# Patient Record
Sex: Female | Born: 1960 | Race: White | Hispanic: No | Marital: Married | Smoking: Never smoker
Health system: Southern US, Community
[De-identification: ages and names within clinical notes are randomized; demographics above are authoritative.]

## PROBLEM LIST (undated history)

## (undated) DIAGNOSIS — J45909 Unspecified asthma, uncomplicated: Secondary | ICD-10-CM

## (undated) DIAGNOSIS — I10 Essential (primary) hypertension: Secondary | ICD-10-CM

## (undated) DIAGNOSIS — E785 Hyperlipidemia, unspecified: Secondary | ICD-10-CM

## (undated) DIAGNOSIS — M8580 Other specified disorders of bone density and structure, unspecified site: Secondary | ICD-10-CM

## (undated) DIAGNOSIS — R7303 Prediabetes: Secondary | ICD-10-CM

## (undated) HISTORY — DX: Essential (primary) hypertension: I10

## (undated) HISTORY — PX: ENDOMETRIAL ABLATION: SHX621

## (undated) HISTORY — DX: Other specified disorders of bone density and structure, unspecified site: M85.80

## (undated) HISTORY — DX: Unspecified asthma, uncomplicated: J45.909

## (undated) HISTORY — DX: Hyperlipidemia, unspecified: E78.5

---

## 1987-10-11 HISTORY — PX: NASAL SINUS SURGERY: SHX719

## 1997-11-30 ENCOUNTER — Other Ambulatory Visit: Admission: RE | Admit: 1997-11-30 | Discharge: 1997-11-30 | Payer: Self-pay | Admitting: Gynecology

## 1997-12-02 ENCOUNTER — Other Ambulatory Visit: Admission: RE | Admit: 1997-12-02 | Discharge: 1997-12-02 | Payer: Self-pay | Admitting: Gynecology

## 1998-01-29 ENCOUNTER — Other Ambulatory Visit: Admission: RE | Admit: 1998-01-29 | Discharge: 1998-01-29 | Payer: Self-pay | Admitting: Gynecology

## 1998-04-26 ENCOUNTER — Other Ambulatory Visit: Admission: RE | Admit: 1998-04-26 | Discharge: 1998-04-26 | Payer: Self-pay | Admitting: Gynecology

## 1998-04-28 ENCOUNTER — Other Ambulatory Visit: Admission: RE | Admit: 1998-04-28 | Discharge: 1998-04-28 | Payer: Self-pay | Admitting: Gynecology

## 1998-04-29 ENCOUNTER — Other Ambulatory Visit: Admission: RE | Admit: 1998-04-29 | Discharge: 1998-04-29 | Payer: Self-pay | Admitting: Gynecology

## 1998-10-15 ENCOUNTER — Ambulatory Visit (HOSPITAL_COMMUNITY): Admission: RE | Admit: 1998-10-15 | Discharge: 1998-10-15 | Payer: Self-pay | Admitting: Obstetrics & Gynecology

## 1999-02-22 ENCOUNTER — Encounter (HOSPITAL_COMMUNITY): Admission: RE | Admit: 1999-02-22 | Discharge: 1999-03-08 | Payer: Self-pay | Admitting: Obstetrics and Gynecology

## 1999-03-02 ENCOUNTER — Encounter: Payer: Self-pay | Admitting: Obstetrics and Gynecology

## 1999-03-02 ENCOUNTER — Inpatient Hospital Stay (HOSPITAL_COMMUNITY): Admission: AD | Admit: 1999-03-02 | Discharge: 1999-03-08 | Payer: Self-pay | Admitting: Obstetrics and Gynecology

## 1999-03-03 ENCOUNTER — Encounter: Payer: Self-pay | Admitting: Obstetrics and Gynecology

## 1999-03-04 ENCOUNTER — Encounter: Payer: Self-pay | Admitting: Obstetrics and Gynecology

## 1999-03-09 ENCOUNTER — Encounter (HOSPITAL_COMMUNITY): Admission: RE | Admit: 1999-03-09 | Discharge: 1999-06-07 | Payer: Self-pay | Admitting: Obstetrics and Gynecology

## 1999-04-21 ENCOUNTER — Other Ambulatory Visit: Admission: RE | Admit: 1999-04-21 | Discharge: 1999-04-21 | Payer: Self-pay | Admitting: Obstetrics and Gynecology

## 2000-03-17 ENCOUNTER — Other Ambulatory Visit: Admission: RE | Admit: 2000-03-17 | Discharge: 2000-03-17 | Payer: Self-pay | Admitting: Obstetrics and Gynecology

## 2000-10-23 ENCOUNTER — Inpatient Hospital Stay (HOSPITAL_COMMUNITY): Admission: AD | Admit: 2000-10-23 | Discharge: 2000-10-25 | Payer: Self-pay | Admitting: Obstetrics and Gynecology

## 2000-10-29 ENCOUNTER — Inpatient Hospital Stay (HOSPITAL_COMMUNITY): Admission: AD | Admit: 2000-10-29 | Discharge: 2000-11-01 | Payer: Self-pay | Admitting: Obstetrics and Gynecology

## 2000-10-29 ENCOUNTER — Encounter: Payer: Self-pay | Admitting: Obstetrics and Gynecology

## 2002-03-01 ENCOUNTER — Other Ambulatory Visit: Admission: RE | Admit: 2002-03-01 | Discharge: 2002-03-01 | Payer: Self-pay | Admitting: Obstetrics and Gynecology

## 2003-03-27 ENCOUNTER — Other Ambulatory Visit: Admission: RE | Admit: 2003-03-27 | Discharge: 2003-03-27 | Payer: Self-pay | Admitting: Obstetrics and Gynecology

## 2004-04-14 ENCOUNTER — Other Ambulatory Visit: Admission: RE | Admit: 2004-04-14 | Discharge: 2004-04-14 | Payer: Self-pay | Admitting: Obstetrics and Gynecology

## 2004-08-19 ENCOUNTER — Other Ambulatory Visit: Admission: RE | Admit: 2004-08-19 | Discharge: 2004-08-19 | Payer: Self-pay | Admitting: Obstetrics and Gynecology

## 2004-10-28 ENCOUNTER — Other Ambulatory Visit: Admission: RE | Admit: 2004-10-28 | Discharge: 2004-10-28 | Payer: Self-pay | Admitting: Obstetrics and Gynecology

## 2005-05-13 ENCOUNTER — Other Ambulatory Visit: Admission: RE | Admit: 2005-05-13 | Discharge: 2005-05-13 | Payer: Self-pay | Admitting: Obstetrics and Gynecology

## 2005-11-28 ENCOUNTER — Other Ambulatory Visit: Admission: RE | Admit: 2005-11-28 | Discharge: 2005-11-28 | Payer: Self-pay | Admitting: Obstetrics and Gynecology

## 2011-01-26 ENCOUNTER — Institutional Professional Consult (permissible substitution): Payer: Self-pay | Admitting: Pulmonary Disease

## 2011-03-23 ENCOUNTER — Institutional Professional Consult (permissible substitution): Payer: Self-pay | Admitting: Pulmonary Disease

## 2011-05-24 ENCOUNTER — Encounter: Payer: Self-pay | Admitting: Pulmonary Disease

## 2011-05-24 ENCOUNTER — Ambulatory Visit (INDEPENDENT_AMBULATORY_CARE_PROVIDER_SITE_OTHER): Payer: Managed Care, Other (non HMO) | Admitting: Pulmonary Disease

## 2011-05-24 ENCOUNTER — Ambulatory Visit (INDEPENDENT_AMBULATORY_CARE_PROVIDER_SITE_OTHER)
Admission: RE | Admit: 2011-05-24 | Discharge: 2011-05-24 | Disposition: A | Discharge status: Home / Self Care | Payer: Managed Care, Other (non HMO) | Source: Ambulatory Visit | Attending: Pulmonary Disease | Admitting: Pulmonary Disease

## 2011-05-24 VITALS — BP 138/88 | HR 85 | Temp 97.8°F | Ht 66.0 in | Wt 143.8 lb

## 2011-05-24 DIAGNOSIS — J209 Acute bronchitis, unspecified: Secondary | ICD-10-CM | POA: Insufficient documentation

## 2011-05-24 DIAGNOSIS — J8283 Eosinophilic asthma: Secondary | ICD-10-CM | POA: Insufficient documentation

## 2011-05-24 DIAGNOSIS — J4551 Severe persistent asthma with (acute) exacerbation: Secondary | ICD-10-CM | POA: Insufficient documentation

## 2011-05-24 DIAGNOSIS — J45909 Unspecified asthma, uncomplicated: Secondary | ICD-10-CM

## 2011-05-24 NOTE — Progress Notes (Signed)
  Subjective:    Patient ID: Carol Acosta, female    DOB: [birth year], 50 y.o.   MRN: [id number]  HPI The patient is a 50 year old female who I've been asked to see for asthma.  The patient states that she has had asthma since childhood, and has been an issue for her all of her life.  She is been found to have multiple allergies, and is being managed by Dr. [place] Callas with allergy vaccine.  She is also once symbicort and Singulair.  The patient states that when she is at her usual baseline, she has no breathing issues and the need to use her rescue inhaler.  I actually have spirometry available from April of this year, at which time the patient states she was having trouble breathing, and it shows no significant airflow obstruction.  The patient states that she gets "sick" possibly 4-6 times a year.  This is usually worse in the fall and spring, and starts with allergy symptoms, worsening shortness of breath, worsening sinus symptoms, and in ends in purulent mucus from both her sinuses and chest.  She is usually treated with an antibiotic as well as a course of prednisone with significant improvement.  Her last flareup was in April of this year.  She has a history of chronic sinusitis and had surgery in 1989.  She has not been seen by ENT recently.  She is on allergy vaccine, but Xolair apparently has never been discussed.  She denies any significant reflux symptoms at this time.   Review of Systems  Constitutional: Negative for fever and unexpected weight change.  HENT: Positive for congestion and sneezing. Negative for ear pain, nosebleeds, sore throat, rhinorrhea, trouble swallowing, dental problem, postnasal drip and sinus pressure.   Eyes: Negative for redness and itching.  Respiratory: Positive for shortness of breath. Negative for cough, chest tightness and wheezing.   Cardiovascular: Negative for palpitations and leg swelling.  Gastrointestinal: Negative for nausea and vomiting.  Genitourinary:  Negative for dysuria.  Musculoskeletal: Negative for joint swelling.  Skin: Negative for rash.  Neurological: Positive for headaches.  Hematological: Does not bruise/bleed easily.  Psychiatric/Behavioral: Negative for dysphoric mood. The patient is not nervous/anxious.        Objective:   Physical Exam Constitutional:  Well developed, no acute distress  HENT:  Nares patent, erythema noted bilat, mild crusting.  Oropharynx without exudate, palate and uvula are normal  Eyes:  Perrla, eomi, no scleral icterus  Neck:  No JVD, no TMG  Cardiovascular:  Normal rate, regular rhythm, no rubs or gallops.  No murmurs        Intact distal pulses  Pulmonary :  Normal breath sounds, no stridor or respiratory distress   No rales, rhonchi, or wheezing  Abdominal:  Soft, nondistended, bowel sounds present.  No tenderness noted.   Musculoskeletal:  No lower extremity edema noted.  Lymph Nodes:  No cervical lymphadenopathy noted  Skin:  No cyanosis noted  Neurologic:  Alert, appropriate, moves all 4 extremities without obvious deficit.         Assessment & Plan:

## 2011-05-24 NOTE — Patient Instructions (Addendum)
Will schedule for scan of sinuses.  If normal, would refer back to Dr. [place] Callas for consideration of xolair.  If abnormal, will need ENT evaluation. No change in your current asthma meds.   Will check a cxr today , and call you with results.

## 2011-05-24 NOTE — Assessment & Plan Note (Signed)
The patient has a long-standing history of asthma since childhood, however has been having frequent flareups on a yearly basis.  She has significant allergies and is receiving allergy vaccine, but continues to have issues despite this.  She denies any history of reflux disease.  She is on an excellent pulmonary regimen, and her spirometry in April was totally normal.  I really do not think there is anything to add to her current regimen.  I am most concerned about the possibility of chronic sinusitis, since almost all of her flares begins with sinus symptoms and then develops into an asthma exacerbation per her history.  I would like to do a scan of her sinuses, and if abnormal she will obviously need ENT referral.  If her scan is unremarkable, I think she needs to go back to allergy for discussion about possible Xolair injections if she qualifies.  There is nothing that I would add from a pulmonary standpoint with regards to medications, especially since she has completely normal exertional tolerance and normal spirometry.  The key will be to identify her triggers and treat these appropriately.

## 2011-05-27 ENCOUNTER — Other Ambulatory Visit: Payer: Managed Care, Other (non HMO)

## 2011-05-27 ENCOUNTER — Telehealth: Payer: Self-pay | Admitting: Pulmonary Disease

## 2011-05-27 NOTE — Telephone Encounter (Signed)
Ct sinuses unremarkable.  Megan, please let pt know that her ct sinus is unremarkable.  I would like her to get back with Dr. [place] Callas to discuss if she needs more aggressive allergy control?  ?xolair.  She is on excellent meds from an asthma standpoint, and her breathing tests were normal recently.

## 2011-05-27 NOTE — Telephone Encounter (Signed)
Called and spoke with pt.  Informed her of CT results and KC's recs.  Pt verbalized understanding and denied any questions.

## 2012-06-10 ENCOUNTER — Encounter (HOSPITAL_COMMUNITY): Payer: Self-pay | Admitting: *Deleted

## 2012-06-10 ENCOUNTER — Emergency Department (HOSPITAL_COMMUNITY)
Admission: EM | Admit: 2012-06-10 | Discharge: 2012-06-10 | Disposition: A | Discharge status: Home / Self Care | Payer: BC Managed Care – PPO | Attending: Emergency Medicine | Admitting: Emergency Medicine

## 2012-06-10 DIAGNOSIS — S61209A Unspecified open wound of unspecified finger without damage to nail, initial encounter: Secondary | ICD-10-CM | POA: Insufficient documentation

## 2012-06-10 DIAGNOSIS — W460XXA Contact with hypodermic needle, initial encounter: Secondary | ICD-10-CM | POA: Insufficient documentation

## 2012-06-10 NOTE — ED Notes (Signed)
Pt accidentally exposed self to an epi-pen. Pt stuck epi-pen through R thumb. Applied heat to thumb upon arrival to ED. Pt's thumb was blanched and tingling. Pt's thumb is warm, pink with small amount of blanching.

## 2015-12-04 ENCOUNTER — Other Ambulatory Visit: Payer: Self-pay | Admitting: Allergy

## 2015-12-04 DIAGNOSIS — J329 Chronic sinusitis, unspecified: Secondary | ICD-10-CM

## 2015-12-09 ENCOUNTER — Other Ambulatory Visit: Payer: Self-pay

## 2018-04-26 ENCOUNTER — Other Ambulatory Visit: Payer: Self-pay | Admitting: Obstetrics and Gynecology

## 2018-04-26 DIAGNOSIS — R928 Other abnormal and inconclusive findings on diagnostic imaging of breast: Secondary | ICD-10-CM

## 2018-04-30 ENCOUNTER — Ambulatory Visit
Admission: RE | Admit: 2018-04-30 | Discharge: 2018-04-30 | Disposition: A | Discharge status: Home / Self Care | Payer: BC Managed Care – PPO | Source: Ambulatory Visit | Attending: Obstetrics and Gynecology | Admitting: Obstetrics and Gynecology

## 2018-04-30 DIAGNOSIS — R928 Other abnormal and inconclusive findings on diagnostic imaging of breast: Secondary | ICD-10-CM

## 2019-03-29 ENCOUNTER — Encounter: Payer: Self-pay | Admitting: Obstetrics and Gynecology

## 2019-04-05 ENCOUNTER — Other Ambulatory Visit: Payer: Self-pay | Admitting: Obstetrics and Gynecology

## 2019-04-05 DIAGNOSIS — Z1231 Encounter for screening mammogram for malignant neoplasm of breast: Secondary | ICD-10-CM

## 2019-05-13 ENCOUNTER — Other Ambulatory Visit: Payer: Self-pay

## 2019-05-13 ENCOUNTER — Ambulatory Visit
Admission: RE | Admit: 2019-05-13 | Discharge: 2019-05-13 | Disposition: A | Discharge status: Home / Self Care | Payer: BC Managed Care – PPO | Source: Ambulatory Visit | Attending: Obstetrics and Gynecology | Admitting: Obstetrics and Gynecology

## 2019-05-13 DIAGNOSIS — Z1231 Encounter for screening mammogram for malignant neoplasm of breast: Secondary | ICD-10-CM

## 2019-05-24 ENCOUNTER — Encounter: Payer: Self-pay | Admitting: Obstetrics and Gynecology

## 2019-05-24 ENCOUNTER — Other Ambulatory Visit: Payer: Self-pay

## 2019-05-28 ENCOUNTER — Encounter: Payer: Self-pay | Admitting: Obstetrics and Gynecology

## 2019-05-29 ENCOUNTER — Other Ambulatory Visit: Payer: Self-pay

## 2019-05-29 ENCOUNTER — Ambulatory Visit: Payer: BC Managed Care – PPO | Admitting: Obstetrics and Gynecology

## 2019-05-29 ENCOUNTER — Encounter: Payer: Self-pay | Admitting: Obstetrics and Gynecology

## 2019-05-29 VITALS — BP 138/84 | HR 100 | Temp 97.6°F | Resp 14 | Ht 65.0 in | Wt 154.6 lb

## 2019-05-29 DIAGNOSIS — R300 Dysuria: Secondary | ICD-10-CM

## 2019-05-29 DIAGNOSIS — Z01419 Encounter for gynecological examination (general) (routine) without abnormal findings: Secondary | ICD-10-CM

## 2019-05-29 DIAGNOSIS — R8290 Unspecified abnormal findings in urine: Secondary | ICD-10-CM

## 2019-05-29 LAB — POCT URINALYSIS DIPSTICK
Bilirubin, UA: NEGATIVE
Glucose, UA: NEGATIVE
Ketones, UA: NEGATIVE
Nitrite, UA: NEGATIVE
Protein, UA: NEGATIVE
Urobilinogen, UA: 0.2 E.U./dL
pH, UA: 5 (ref 5.0–8.0)

## 2019-05-29 MED ORDER — SULFAMETHOXAZOLE-TRIMETHOPRIM 800-160 MG PO TABS: 1.0000 | ORAL_TABLET | Freq: Two times a day (BID) | ORAL | 0 refills | Status: DC

## 2019-05-29 NOTE — Progress Notes (Signed)
58 y.o. G2P2 Married Caucasian female here for annual exam.    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   Left side of her face feels numb, starting 3 days ago.  Worse last night, feeling better today.  She does wear a night guard due to clenching.   Feels stressed and exhausted.  No headache currently.  Hx occasional migraine.  No difficulty with word finding.  Has not eaten much today.   Patient recently treated for UTI and not sure completely resolved. Did 7 days of abx.  Macrobid 100 mg.  Symptoms returned again after completing abx.   Took Monistat 3, AZO, cranberry.  Left sided discomfort, senses as pressure, for one week. Last BM was today with diarrhea, which did not relieve the pressure.  Denies vaginal bleeding.  On HRT for 4 years.  Taking HRT every other day. This controls her hot flashes.   Urine Dip:1+RBCs.    PCP: Merri BrunetteWalter Pharr, MD    Patient's last menstrual period was 05/10/2018 (approximate).    LMP was 2002.  Some spotting since then, last time was about 1 year ago.        Sexually active: Yes.    The current method of family planning is none--Ablation.    Exercising: No.  The patient does not participate in regular exercise at present. Smoker:  no  Health Maintenance: Pap: 10-29-14 Neg:Neg HR HPV--pt. Thought had pap 2017 History of abnormal Pap:  Yes, years ago ??may have had cryotherapy MMG:05-13-19 3D Neg/density C/BiRads1  Colonoscopy: 2015 normal;next 10 years BMD:   2018  Result :Osteopenia.  PCP.  TDaP: PCP Gardasil:   NA.  HIV:no Hep C:no Screening Labs:  ---   reports that she has never smoked. She has never used smokeless tobacco. She reports previous alcohol use. She reports that she does not use drugs.  Past Medical History:  Diagnosis Date  . Allergic rhinitis   . Asthma   . Hyperlipidemia   . Hypertension   . Osteopenia     Past Surgical History:  Procedure Laterality Date  . CESAREAN SECTION  2000   . ENDOMETRIAL ABLATION    . NASAL SINUS SURGERY  1989    Current Outpatient Medications  Medication Sig Dispense Refill  . albuterol (PROAIR HFA) 108 (90 Base) MCG/ACT inhaler Inhale 1 puff into the lungs as needed.    Marland Kitchen. albuterol (PROVENTIL) (2.5 MG/3ML) 0.083% nebulizer solution     . BREO ELLIPTA 200-25 MCG/INH AEPB Inhale 1 puff into the lungs daily.    . clotrimazole-betamethasone (LOTRISONE) cream Apply 1 application topically 2 (two) times daily.    Marland Kitchen. desloratadine (CLARINEX) 5 MG tablet Take 5 mg by mouth daily.      Marland Kitchen. estrogen, conjugated,-medroxyprogesterone (PREMPRO) 0.45-1.5 MG tablet Take 1 tablet by mouth every other day.    . pravastatin (PRAVACHOL) 40 MG tablet 1/4 tablet daily    . sertraline (ZOLOFT) 25 MG tablet Take 1 tablet by mouth as needed.    . triamterene-hydrochlorothiazide (DYAZIDE) 37.5-25 MG capsule Take 1 capsule by mouth every other day.     No current facility-administered medications for this visit.     Family History  Problem Relation Age of Onset  . COPD Maternal Grandmother   . Hypertension Maternal Grandmother   . Allergic rhinitis Brother   . Dementia Brother   . Allergic rhinitis Brother   . Asthma Mother   . Allergies Mother   . Hypertension Mother   .  Thyroid disease Mother     Review of Systems  All other systems reviewed and are negative.   Exam:   BP 138/84   Pulse 100   Temp 97.6 F (36.4 C) (Temporal)   Resp 14   Ht 5\' 5"  (1.651 m)   Wt 154 lb 9.6 oz (70.1 kg)   LMP 05/10/2018 (Approximate)   BMI 25.73 kg/m     General appearance: alert, cooperative and appears stated age Head: normocephalic, without obvious abnormality, atraumatic Neck: no adenopathy, supple, symmetrical, trachea midline and thyroid normal to inspection and palpation Lungs: clear to auscultation bilaterally Breasts: normal appearance, no masses or tenderness, No nipple retraction or dimpling, No nipple discharge or bleeding, No axillary  adenopathy Heart: regular rate and rhythm Abdomen: soft, non-tender; no masses, no organomegaly Extremities: extremities normal, atraumatic, no cyanosis or edema Skin: skin color, texture, turgor normal. No rashes or lesions Lymph nodes: cervical, supraclavicular, and axillary nodes normal. Neurologic: grossly normal  Pelvic: External genitalia:  no lesions              No abnormal inguinal nodes palpated.              Urethra:  normal appearing urethra with no masses, tenderness or lesions              Bartholins and Skenes: normal                 Vagina: normal appearing vagina with normal color and discharge, no lesions              Cervix: no lesions              Pap taken: No. Bimanual Exam:  Uterus:  normal size, contour, position, consistency, mobility, non-tender              Adnexa: no mass, fullness, tenderness              Rectal exam: Yes.  .  Confirms.              Anus:  normal sphincter tone, no lesions  Chaperone was present for exam.  Assessment:   Well woman visit with normal exam. Status post endometrial ablation.  HRT.  Abnormal urine dip.  Recent UTI.  Osteopenia.  Left facial numbness.  HTN and elevated cholesterol.  Plan: Mammogram screening discussed. Self breast awareness reviewed. Pap and HR HPV as above. Guidelines for Calcium, Vitamin D, regular exercise program including cardiovascular and weight bearing exercise. Stop HRT. Start [employer].  Urine micro and cx sent.  Bactrim DS.  She will contact her PCP regarding her stress.  She in interested in resuming treatment for this.  She knows to call 911 if her facial numbness increases or does not resolve. Follow up annually and prn.   After visit summary provided.

## 2019-05-29 NOTE — Patient Instructions (Signed)

## 2019-05-30 LAB — URINE CULTURE: Organism ID, Bacteria: NO GROWTH

## 2019-05-30 LAB — URINALYSIS, MICROSCOPIC ONLY: RBC: NONE SEEN /hpf (ref 0–2)

## 2019-05-31 ENCOUNTER — Telehealth: Payer: Self-pay | Admitting: Obstetrics and Gynecology

## 2019-05-31 NOTE — Telephone Encounter (Signed)
Patient is calling regarding Bactrim DS. Patient stated that she cannot handle the medication. Patient stated that after 1 dose she experienced fast heart rate (109-119bpm), panic attack, nausea and dizziness. Patient stated that she has been seen by her PCP today who did an EKG and is treating her for anxiety.   Patient is also wanting to know results from urine culture.

## 2019-05-31 NOTE — Telephone Encounter (Signed)
Urine culture is negative so ok to stop antibiotics.  As this was negative, does not need new rx.  CC: Dr. Quincy Simmonds

## 2019-05-31 NOTE — Telephone Encounter (Signed)
Routing to covering provider, Dr. Sabra Heck, to review and advise.   Dr. Sabra Heck -please review 05/29/19 UC results.   Cc: Dr. Quincy Simmonds

## 2019-05-31 NOTE — Telephone Encounter (Signed)
Spoke with patient, advised per Dr. Sabra Heck. Patient states she discussed with Dr. Quincy Simmonds the tingling sensation she was experiencing and being related to menopause if UC was negative. Patient has not started OTC estroven, asking if a vaginal cream RX would be appropriate? Patient did see PCP today for increased "stress and anxiety", meds have been changed, has f/u with cardiologist. Advised patient will forward to Dr. Quincy Simmonds to review on 8/24 and return call, patient agreeable.   Dr. Quincy Simmonds -please advise.

## 2019-06-03 ENCOUNTER — Ambulatory Visit: Payer: BC Managed Care – PPO | Admitting: Obstetrics and Gynecology

## 2019-06-03 ENCOUNTER — Encounter: Payer: Self-pay | Admitting: Obstetrics and Gynecology

## 2019-06-03 ENCOUNTER — Other Ambulatory Visit: Payer: Self-pay

## 2019-06-03 VITALS — BP 144/82 | HR 88 | Temp 97.3°F | Ht 65.0 in | Wt 153.4 lb

## 2019-06-03 DIAGNOSIS — N760 Acute vaginitis: Secondary | ICD-10-CM

## 2019-06-03 DIAGNOSIS — F419 Anxiety disorder, unspecified: Secondary | ICD-10-CM | POA: Diagnosis not present

## 2019-06-03 NOTE — Patient Instructions (Signed)
Vaginitis Vaginitis is a condition in which the vaginal tissue swells and becomes red (inflamed). This condition is most often caused by a change in the normal balance of bacteria and yeast that live in the vagina. This change causes an overgrowth of certain bacteria or yeast, which causes the inflammation. There are different types of vaginitis, but the most common types are:  Bacterial vaginosis.  Yeast infection (candidiasis).  Trichomoniasis vaginitis. This is a sexually transmitted disease (STD).  Viral vaginitis.  Atrophic vaginitis.  Allergic vaginitis. What are the causes? The cause of this condition depends on the type of vaginitis. It can be caused by:  Bacteria (bacterial vaginosis).  Yeast, which is a fungus (yeast infection).  A parasite (trichomoniasis vaginitis).  A virus (viral vaginitis).  Low hormone levels (atrophic vaginitis). Low hormone levels can occur during pregnancy, breastfeeding, or after menopause.  Irritants, such as bubble baths, scented tampons, and feminine sprays (allergic vaginitis). Other factors can change the normal balance of the yeast and bacteria that live in the vagina. These include:  Antibiotic medicines.  Poor hygiene.  Diaphragms, vaginal sponges, spermicides, birth control pills, and intrauterine devices (IUD).  Sex.  Infection.  Uncontrolled diabetes.  A weakened defense (immune) system. What increases the risk? This condition is more likely to develop in women who:  Smoke.  Use vaginal douches, scented tampons, or scented sanitary pads.  Wear tight-fitting pants.  Wear thong underwear.  Use oral birth control pills or an IUD.  Have sex without a condom.  Have multiple sex partners.  Have an STD.  Frequently use the spermicide nonoxynol-9.  Eat lots of foods high in sugar.  Have uncontrolled diabetes.  Have low estrogen levels.  Have a weakened immune system from an immune disorder or medical  treatment.  Are pregnant or breastfeeding. What are the signs or symptoms? Symptoms vary depending on the cause of the vaginitis. Common symptoms include:  Abnormal vaginal discharge. ? The discharge is white, gray, or yellow with bacterial vaginosis. ? The discharge is thick, white, and cheesy with a yeast infection. ? The discharge is frothy and yellow or greenish with trichomoniasis.  A bad vaginal smell. The smell is fishy with bacterial vaginosis.  Vaginal itching, pain, or swelling.  Sex that is painful.  Pain or burning when urinating. Sometimes there are no symptoms. How is this diagnosed? This condition is diagnosed based on your symptoms and medical history. A physical exam, including a pelvic exam, will also be done. You may also have other tests, including:  Tests to determine the pH level (acidity or alkalinity) of your vagina.  A whiff test, to assess the odor that results when a sample of your vaginal discharge is mixed with a potassium hydroxide solution.  Tests of vaginal fluid. A sample will be examined under a microscope. How is this treated? Treatment varies depending on the type of vaginitis you have. Your treatment may include:  Antibiotic creams or pills to treat bacterial vaginosis and trichomoniasis.  Antifungal medicines, such as vaginal creams or suppositories, to treat a yeast infection.  Medicine to ease discomfort if you have viral vaginitis. Your sexual partner should also be treated.  Estrogen delivered in a cream, pill, suppository, or vaginal ring to treat atrophic vaginitis. If vaginal dryness occurs, lubricants and moisturizing creams may help. You may need to avoid scented soaps, sprays, or douches.  Stopping use of a product that is causing allergic vaginitis. Then using a vaginal cream to treat the symptoms. Follow   these instructions at home: Lifestyle  Keep your genital area clean and dry. Avoid soap, and only rinse the area with  water.  Do not douche or use tampons until your health care provider says it is okay to do so. Use sanitary pads, if needed.  Do not have sex until your health care provider approves. When you can return to sex, practice safe sex and use condoms.  Wipe from front to back. This avoids the spread of bacteria from the rectum to the vagina. General instructions  Take over-the-counter and prescription medicines only as told by your health care provider.  If you were prescribed an antibiotic medicine, take or use it as told by your health care provider. Do not stop taking or using the antibiotic even if you start to feel better.  Keep all follow-up visits as told by your health care provider. This is important. How is this prevented?  Use mild, non-scented products. Do not use things that can irritate the vagina, such as fabric softeners. Avoid the following products if they are scented: ? Feminine sprays. ? Detergents. ? Tampons. ? Feminine hygiene products. ? Soaps or bubble baths.  Let air reach your genital area. ? Wear cotton underwear to reduce moisture buildup. ? Avoid wearing underwear while you sleep. ? Avoid wearing tight pants and underwear or nylons without a cotton panel. ? Avoid wearing thong underwear.  Take off any wet clothing, such as bathing suits, as soon as possible.  Practice safe sex and use condoms. Contact a health care provider if:  You have abdominal pain.  You have a fever.  You have symptoms that last for more than 2-3 days. Get help right away if:  You have a fever and your symptoms suddenly get worse. Summary  Vaginitis is a condition in which the vaginal tissue becomes inflamed.This condition is most often caused by a change in the normal balance of bacteria and yeast that live in the vagina.  Treatment varies depending on the type of vaginitis you have.  Do not douche, use tampons , or have sex until your health care provider approves. When  you can return to sex, practice safe sex and use condoms. This information is not intended to replace advice given to you by your health care provider. Make sure you discuss any questions you have with your health care provider. Document Released: 07/24/2007 Document Revised: 09/08/2017 Document Reviewed: 11/01/2016 Elsevier Patient Education  2020 [employer]  

## 2019-06-03 NOTE — Telephone Encounter (Signed)
Would it be ok for the patient to return for a recheck appointment with me so I can help her best? She had a lot going on at her visit the other day.

## 2019-06-03 NOTE — Telephone Encounter (Signed)
Spoke with patient, advised per Dr. Quincy Simmonds. OV scheduled for today at 4:30pm. Covid19 prescreen negative, precautions reviewed.   Routing to provider for final review. Patient is agreeable to disposition. Will close encounter.

## 2019-06-03 NOTE — Progress Notes (Signed)
GYNECOLOGY  VISIT   HPI: 58 y.o.   Married  Caucasian  female   G2P2 with No LMP recorded. Patient has had an ablation.   here for consult regarding HRT.    Patient states one does of Bactrim increased heart rate and felt caused nausea. She went to PCP about increased heart rate and was placed on Effexor and couldn't eat while taking it. PCP is referring to cardiologist.   She has taken some Ativan, which has relaxed her but she had to take a lesser dosage. Feels she may be having panic attacks. She has used Zoloft in the past, and she did well with this.   She still has some left facial numbness.   Her urinary and vaginal symptoms are some burning at her bottom at the opening of the vagina since August 2.  Her symptoms are increased.   GYNECOLOGIC HISTORY: No LMP recorded. Patient has had an ablation. Contraception:  Ablation Menopausal hormone therapy: Prempro Last mammogram: 05-13-19 3D Neg/density C/BiRads1  Last pap smear: 10-29-14 Neg:Neg HR HPV--pt. Thought had pap 2017        OB History    Gravida  2   Para  2   Term      Preterm      AB      Living  3     SAB      TAB      Ectopic      Multiple  1   Live Births                 Patient Active Problem List   Diagnosis Date Noted  . Asthma, intrinsic 05/24/2011    Past Medical History:  Diagnosis Date  . Allergic rhinitis   . Asthma   . Hyperlipidemia   . Hypertension   . Osteopenia     Past Surgical History:  Procedure Laterality Date  . CESAREAN SECTION  2000  . ENDOMETRIAL ABLATION    . NASAL SINUS SURGERY  1989    Current Outpatient Medications  Medication Sig Dispense Refill  . albuterol (PROAIR HFA) 108 (90 Base) MCG/ACT inhaler Inhale 1 puff into the lungs as needed.    Marland Kitchen. albuterol (PROVENTIL) (2.5 MG/3ML) 0.083% nebulizer solution     . BREO ELLIPTA 200-25 MCG/INH AEPB Inhale 1 puff into the lungs daily.    . clotrimazole-betamethasone (LOTRISONE) cream Apply 1 application  topically 2 (two) times daily.    Marland Kitchen. desloratadine (CLARINEX) 5 MG tablet Take 5 mg by mouth daily.      Marland Kitchen. estrogen, conjugated,-medroxyprogesterone (PREMPRO) 0.45-1.5 MG tablet Take 1 tablet by mouth every other day.    . pravastatin (PRAVACHOL) 40 MG tablet 1/4 tablet daily    . sertraline (ZOLOFT) 25 MG tablet Take 1 tablet by mouth as needed.    . sulfamethoxazole-trimethoprim (BACTRIM DS) 800-160 MG tablet Take 1 tablet by mouth 2 (two) times daily. One PO BID x 3 days 6 tablet 0  . triamterene-hydrochlorothiazide (DYAZIDE) 37.5-25 MG capsule Take 1 capsule by mouth every other day.     No current facility-administered medications for this visit.      ALLERGIES: Patient has no known allergies.  Family History  Problem Relation Age of Onset  . COPD Maternal Grandmother   . Hypertension Maternal Grandmother   . Allergic rhinitis Brother   . Dementia Brother   . Allergic rhinitis Brother   . Asthma Mother   . Allergies Mother   . Hypertension Mother   .  Thyroid disease Mother     Social History   Socioeconomic History  . Marital status: Married    Spouse name: Not on file  . Number of children: 3  . Years of education: Not on file  . Highest education level: Not on file  Occupational History  . 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 Financial resource strain: Not on file  . Food insecurity    Worry: Not on file    Inability: Not on file  . Transportation needs    Medical: Not on file    Non-medical: Not on file  Tobacco Use  . Smoking status: Never Smoker  . Smokeless tobacco: Never Used  Substance and Sexual Activity  . Alcohol use: Not Currently  . Drug use: No  . Sexual activity: Not Currently    Birth control/protection: None  Lifestyle  . Physical activity    Days per week: Not on file    Minutes per session: Not on file  . Stress: Not on file  Relationships  . Social Herbalist on phone: Not on file    Gets together: Not on file     Attends religious service: Not on file    Active member of club or organization: Not on file    Attends meetings of clubs or organizations: Not on file    Relationship status: Not on file  . Intimate partner violence    Fear of current or ex partner: Not on file    Emotionally abused: Not on file    Physically abused: Not on file    Forced sexual activity: Not on file  Other Topics Concern  . Not on file  Social History Narrative  . Not on file    Review of Systems  Cardiovascular:       Tachycardia  Psychiatric/Behavioral: The patient is nervous/anxious.   All other systems reviewed and are negative.   PHYSICAL EXAMINATION:    BP (!) 144/82   Pulse 88   Temp (!) 97.3 F (36.3 C) (Temporal)   Ht 5\' 5"  (1.651 m)   Wt 153 lb 6.4 oz (69.6 kg)   BMI 25.53 kg/m     General appearance: alert, cooperative and appears stated age   Pelvic: External genitalia:  no lesions              Urethra:  normal appearing urethra with no masses, tenderness or lesions              Bartholins and Skenes: normal                 Vagina: normal appearing vagina with normal color and discharge, no lesions              Cervix: no lesions                Bimanual Exam:  Uterus:  normal size, contour, position, consistency, mobility, non-tender              Adnexa: no mass, fullness, tenderness             Chaperone was present for exam.  ASSESSMENT  Vulvovaginitis.  Anxiety.   PLAN  Affirm done.  We talked about vaginitis forms.  Will consider vaginal estrogen after her vaginitis testing has returned. If symptoms persist, she wil return.  She will contact her PCP about switching back to Zoloft and consider a shot acting benzodiazapine.  She will follow through with  cardiology.    An After Visit Summary was printed and given to the patient.  __25____ minutes face to face time of which over 50% was spent in counseling.

## 2019-06-06 LAB — VAGINITIS/VAGINOSIS, DNA PROBE
Candida Species: NEGATIVE
Gardnerella vaginalis: NEGATIVE
Trichomonas vaginosis: NEGATIVE

## 2019-06-07 ENCOUNTER — Other Ambulatory Visit: Payer: Self-pay | Admitting: *Deleted

## 2019-06-07 MED ORDER — PREMARIN 0.625 MG/GM VA CREA: TOPICAL_CREAM | VAGINAL | 3 refills | Status: DC

## 2019-06-11 DIAGNOSIS — Z8616 Personal history of COVID-19: Secondary | ICD-10-CM

## 2019-06-11 HISTORY — DX: Personal history of COVID-19: Z86.16

## 2019-06-21 ENCOUNTER — Ambulatory Visit: Payer: Self-pay | Admitting: Cardiology

## 2019-06-27 ENCOUNTER — Ambulatory Visit: Payer: Self-pay | Admitting: Cardiology

## 2019-07-18 ENCOUNTER — Encounter: Payer: Self-pay | Admitting: Cardiology

## 2019-07-18 ENCOUNTER — Ambulatory Visit: Payer: BC Managed Care – PPO | Admitting: Cardiology

## 2019-07-18 ENCOUNTER — Other Ambulatory Visit: Payer: Self-pay

## 2019-07-18 VITALS — BP 148/80 | HR 96 | Temp 98.0°F | Ht 67.0 in | Wt 148.0 lb

## 2019-07-18 DIAGNOSIS — R002 Palpitations: Secondary | ICD-10-CM | POA: Diagnosis not present

## 2019-07-18 DIAGNOSIS — E782 Mixed hyperlipidemia: Secondary | ICD-10-CM

## 2019-07-18 DIAGNOSIS — I10 Essential (primary) hypertension: Secondary | ICD-10-CM

## 2019-07-18 DIAGNOSIS — R0600 Dyspnea, unspecified: Secondary | ICD-10-CM

## 2019-07-18 DIAGNOSIS — Z20822 Contact with and (suspected) exposure to covid-19: Secondary | ICD-10-CM

## 2019-07-18 DIAGNOSIS — R202 Paresthesia of skin: Secondary | ICD-10-CM

## 2019-07-18 DIAGNOSIS — R0609 Other forms of dyspnea: Secondary | ICD-10-CM

## 2019-07-18 MED ORDER — ASPIRIN EC 81 MG PO TBEC: 81.0000 mg | DELAYED_RELEASE_TABLET | Freq: Every day | ORAL | 3 refills | Status: DC

## 2019-07-18 MED ORDER — DILTIAZEM HCL ER COATED BEADS 120 MG PO CP24: 120.0000 mg | ORAL_CAPSULE | Freq: Every day | ORAL | 3 refills | Status: DC

## 2019-07-18 MED ORDER — ROSUVASTATIN CALCIUM 20 MG PO TABS: 20.0000 mg | ORAL_TABLET | Freq: Every day | ORAL | 3 refills | Status: DC

## 2019-07-18 NOTE — Progress Notes (Signed)
Patient referred by [name], MD for palpitations, numbness.  Subjective:   Carol Acosta, female    DOB: 1961-08-09, 58 y.o.   MRN: [id number]   Chief Complaint  Patient presents with  . Tachycardia  . Hypertension    HPI  58 y.o. Caucasian female with hypertension, hyperlipidemia, asthma, anxiety, referred for evaluation of palpitations.  Patient is a Education officer, museum.  She endorses a lot of stress related to schoolwork during the pandemic.  She is planning on retiring soon.  In August 2020, she had COVID infection for which she was treated at home.  Since then, she has noticed persistent exertional dyspnea.  She had episodes of tachycardia soon after the infection which has since resolved.  She had at least 2 distinct episodes of tingling numbness on left side of the face and left side of her body.  These episodes lasted for 2-3 days at times, but were recurrent.  She denied any focal weakness, slurred speech, facial droop, visual changes.  She denies any clear chest pain.  For last few days, she has again noticed a sore throat.  She did have 1 spike of temperature 100.4 F in last week of September which was at least a month after her COVID infection.  She has not had any fever, chills, cough since then.  She endorses that she does have seasonal allergies around this time of the year.  Patient is most concerned about her numbness episodes and wonders if she had TIA.  Blood pressure is elevated today.  Reviewed patient's labs, including lipid panel.    Past Medical History:  Diagnosis Date  . Allergic rhinitis   . Asthma   . Hyperlipidemia   . Hypertension   . Osteopenia      Past Surgical History:  Procedure Laterality Date  . CESAREAN SECTION  2000  . ENDOMETRIAL ABLATION    . NASAL SINUS SURGERY  1989     Social History   Socioeconomic History  . Marital status: Married    Spouse name: Not on file  . Number of children: 3  . Years of education: Not on  file  . Highest education level: Not on file  Occupational History  . 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 Financial resource strain: Not on file  . Food insecurity    Worry: Not on file    Inability: Not on file  . Transportation needs    Medical: Not on file    Non-medical: Not on file  Tobacco Use  . Smoking status: Never Smoker  . Smokeless tobacco: Never Used  Substance and Sexual Activity  . Alcohol use: Not Currently  . Drug use: No  . Sexual activity: Not Currently    Birth control/protection: None  Lifestyle  . Physical activity    Days per week: Not on file    Minutes per session: Not on file  . Stress: Not on file  Relationships  . Social Herbalist on phone: Not on file    Gets together: Not on file    Attends religious service: Not on file    Active member of club or organization: Not on file    Attends meetings of clubs or organizations: Not on file    Relationship status: Not on file  . Intimate partner violence    Fear of current or ex partner: Not on file    Emotionally abused: Not on file    Physically  abused: Not on file    Forced sexual activity: Not on file  Other Topics Concern  . Not on file  Social History Narrative  . Not on file     Family History  Problem Relation Age of Onset  . COPD Maternal Grandmother   . Hypertension Maternal Grandmother   . Allergic rhinitis Brother   . Dementia Brother   . Allergic rhinitis Brother   . Asthma Mother   . Allergies Mother   . Hypertension Mother   . Thyroid disease Mother      Current Outpatient Medications on File Prior to Visit  Medication Sig Dispense Refill  . albuterol (PROAIR HFA) 108 (90 Base) MCG/ACT inhaler Inhale 1 puff into the lungs as needed.    Marland Kitchen. albuterol (PROVENTIL) (2.5 MG/3ML) 0.083% nebulizer solution     . BREO ELLIPTA 200-25 MCG/INH AEPB Inhale 1 puff into the lungs daily.    . clotrimazole-betamethasone (LOTRISONE) cream Apply 1 application  topically 2 (two) times daily.    Marland Kitchen. conjugated estrogens (PREMARIN) vaginal cream Place 1/2 gram vaginally at hs x2 weeks, and then 1/2 gram vaginally at hs two to three times a week. 30 g 3  . desloratadine (CLARINEX) 5 MG tablet Take 5 mg by mouth daily.      . pravastatin (PRAVACHOL) 40 MG tablet 1/4 tablet daily    . sertraline (ZOLOFT) 25 MG tablet Take 1 tablet by mouth as needed.    . triamterene-hydrochlorothiazide (DYAZIDE) 37.5-25 MG capsule Take 1 capsule by mouth every other day.     No current facility-administered medications on file prior to visit.     Cardiovascular studies:  EKG 07/18/2019: Sinus rhythm 98 bpm.  Nonspecific ST depression.  EKG 05/31/2019: Sinus rhythm 80 bpm.  Nonspecific ST-T abnormality.  Recent labs: 05/15/2019: H/H 16/48.  MCV 85.  Platelets 295. Cholesterol 261, triglycerides 139, HDL 54, LDL 179  Review of Systems  Constitution: Negative for decreased appetite, malaise/fatigue, weight gain and weight loss.  HENT: Positive for hoarse voice and sore throat. Negative for congestion.   Eyes: Negative for visual disturbance.  Cardiovascular: Positive for palpitations. Negative for chest pain, dyspnea on exertion, leg swelling and syncope.  Endocrine: Negative for cold intolerance.  Hematologic/Lymphatic: Does not bruise/bleed easily.  Skin: Negative for itching and rash.  Musculoskeletal: Negative for myalgias.  Gastrointestinal: Negative for abdominal pain, nausea and vomiting.  Genitourinary: Negative for dysuria.  Neurological: Positive for numbness and paresthesias. Negative for dizziness and weakness.  Psychiatric/Behavioral: The patient is not nervous/anxious.   All other systems reviewed and are negative.        Vitals:   07/18/19 1357  BP: (!) 148/80  Pulse: 96  Temp: 98 F (36.7 C)  SpO2: 96%     Body mass index is 23.18 kg/m. Filed Weights   07/18/19 1357  Weight: 148 lb (67.1 kg)     Objective:   Physical Exam   Constitutional: She is oriented to person, place, and time. She appears well-developed and well-nourished. No distress.  HENT:  Head: Normocephalic and atraumatic.  Eyes: Pupils are equal, round, and reactive to light. Conjunctivae are normal.  Neck: No JVD present.  Cardiovascular: Normal rate, regular rhythm and intact distal pulses.  Pulmonary/Chest: Effort normal and breath sounds normal. She has no wheezes. She has no rales.  Abdominal: Soft. Bowel sounds are normal. There is no rebound.  Musculoskeletal:        General: No edema.  Lymphadenopathy:    She  has no cervical adenopathy.  Neurological: She is alert and oriented to person, place, and time. No cranial nerve deficit.  Skin: Skin is warm and dry.  Psychiatric: She has a normal mood and affect.  Nursing note and vitals reviewed.         Assessment & Recommendations:   58 y.o. Caucasian female with hypertension, hyperlipidemia, asthma, anxiety, referred for evaluation of palpitations.  Palpitations: I suspect these were most likely related to her COVID infection, as well as anxiety.  Resting heart rate is normal at this time.  Palpitations have currently resolved.  I will obtain echocardiogram to rule out any structural cardiac abnormalities.  I will start her on diltiazem 120 mg in view of hypertension, as well as palpitations.  Exertional dyspnea: Chronologically, the symptoms started only after COVID infection.  Patient has underlying asthma.  Her symptoms are most likely due to post COVID residual effects.  However, I will obtain echocardiogram, as above.   Hyperlipidemia: LDL is 179.  She has been on pravastatin with no significant improvement in her LDL.  I have switched pravastatin to rosuvastatin 20 mg daily.  Will repeat lipid panel in 3 months.  If no improvement, will need to consider PCSK9 inhibitors.  Numbness, paresthesias: History is quite atypical for TIA.  However, patient has significant risk factors  with hyperlipidemia and hypertension.  I will obtain CT head and CTA head/neck to rule out any infarct.  Sore throat: Mild this is very likely related to her seasonal allergies and asthma, patient has recently had COVID infection.  She had at least one spike of fever since her infection- and last week of September.  I have encouraged her to get herself tested again for COVID.   Thank you for referring the patient to [place]. Please feel free to contact with any questions.  Elder Negus, MD [facility] [place] Pager: [phone] Office: [phone] If no answer Cell [phone]

## 2019-07-19 LAB — NOVEL CORONAVIRUS, NAA: SARS-CoV-2, NAA: NOT DETECTED

## 2019-07-20 ENCOUNTER — Encounter: Payer: Self-pay | Admitting: Cardiology

## 2019-07-20 DIAGNOSIS — E782 Mixed hyperlipidemia: Secondary | ICD-10-CM | POA: Insufficient documentation

## 2019-07-20 DIAGNOSIS — R202 Paresthesia of skin: Secondary | ICD-10-CM | POA: Insufficient documentation

## 2019-07-20 DIAGNOSIS — R0609 Other forms of dyspnea: Secondary | ICD-10-CM | POA: Insufficient documentation

## 2019-07-20 DIAGNOSIS — I10 Essential (primary) hypertension: Secondary | ICD-10-CM | POA: Insufficient documentation

## 2019-08-05 ENCOUNTER — Ambulatory Visit
Admission: RE | Admit: 2019-08-05 | Discharge: 2019-08-05 | Disposition: A | Discharge status: Home / Self Care | Payer: BC Managed Care – PPO | Source: Ambulatory Visit | Attending: Cardiology | Admitting: Cardiology

## 2019-08-05 ENCOUNTER — Ambulatory Visit (INDEPENDENT_AMBULATORY_CARE_PROVIDER_SITE_OTHER): Payer: BC Managed Care – PPO

## 2019-08-05 ENCOUNTER — Other Ambulatory Visit: Payer: Self-pay

## 2019-08-05 DIAGNOSIS — R002 Palpitations: Secondary | ICD-10-CM

## 2019-08-05 DIAGNOSIS — R202 Paresthesia of skin: Secondary | ICD-10-CM

## 2019-08-05 DIAGNOSIS — I10 Essential (primary) hypertension: Secondary | ICD-10-CM

## 2019-08-05 MED ORDER — IOPAMIDOL (ISOVUE-370) INJECTION 76%
75.0000 mL | Freq: Once | INTRAVENOUS | Status: AC | PRN
  Administered 2019-08-05: 14:00:00 75 mL via INTRAVENOUS

## 2019-08-06 NOTE — Progress Notes (Signed)
Called pt no answer, left a vm

## 2019-08-06 NOTE — Progress Notes (Signed)
S/w pt advised her of test results

## 2019-08-16 ENCOUNTER — Ambulatory Visit: Payer: BC Managed Care – PPO | Admitting: Cardiology

## 2019-08-26 ENCOUNTER — Encounter: Payer: Self-pay | Admitting: Cardiology

## 2019-08-26 ENCOUNTER — Other Ambulatory Visit: Payer: Self-pay

## 2019-08-26 ENCOUNTER — Ambulatory Visit (INDEPENDENT_AMBULATORY_CARE_PROVIDER_SITE_OTHER): Payer: BC Managed Care – PPO | Admitting: Cardiology

## 2019-08-26 VITALS — BP 121/59 | HR 82 | Temp 98.0°F | Ht 65.0 in | Wt 155.0 lb

## 2019-08-26 DIAGNOSIS — I10 Essential (primary) hypertension: Secondary | ICD-10-CM

## 2019-08-26 DIAGNOSIS — E782 Mixed hyperlipidemia: Secondary | ICD-10-CM | POA: Diagnosis not present

## 2019-08-26 NOTE — Progress Notes (Signed)
Patient referred by [name], MD for palpitations, numbness.  Subjective:   Carol Acosta, female    DOB: 09-Jun-1961, 58 y.o.   MRN: [id number]   Chief Complaint  Patient presents with  . Hypertension  . Palpitations  . Results    echo, CT results  . Follow-up    4wk    HPI  58 y.o. Caucasian female with hypertension, hyperlipidemia, asthma, anxiety, palpitations, tingling, numbness.  All symptoms are resolved after decreasing stress after retirement.  She is trying to exercise regularly without any symptoms of chest pain, shortness of breath, palpitations. She has lipid panel scheduled tomorrow with her PCP.   Past Medical History:  Diagnosis Date  . Allergic rhinitis   . Asthma   . Hyperlipidemia   . Hypertension   . Osteopenia      Past Surgical History:  Procedure Laterality Date  . CESAREAN SECTION  2000  . ENDOMETRIAL ABLATION    . NASAL SINUS SURGERY  1989     Social History   Socioeconomic History  . Marital status: Married    Spouse name: Not on file  . Number of children: 3  . Years of education: Not on file  . Highest education level: Not on file  Occupational History  . 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 Financial resource strain: Not on file  . Food insecurity    Worry: Not on file    Inability: Not on file  . Transportation needs    Medical: Not on file    Non-medical: Not on file  Tobacco Use  . Smoking status: Never Smoker  . Smokeless tobacco: Never Used  Substance and Sexual Activity  . Alcohol use: Not Currently  . Drug use: No  . Sexual activity: Not Currently    Birth control/protection: None  Lifestyle  . Physical activity    Days per week: Not on file    Minutes per session: Not on file  . Stress: Not on file  Relationships  . Social Musician on phone: Not on file    Gets together: Not on file    Attends religious service: Not on file    Active member of club or organization: Not on  file    Attends meetings of clubs or organizations: Not on file    Relationship status: Not on file  . Intimate partner violence    Fear of current or ex partner: Not on file    Emotionally abused: Not on file    Physically abused: Not on file    Forced sexual activity: Not on file  Other Topics Concern  . Not on file  Social History Narrative  . Not on file     Family History  Problem Relation Age of Onset  . COPD Maternal Grandmother   . Hypertension Maternal Grandmother   . Allergic rhinitis Brother   . Dementia Brother   . Allergic rhinitis Brother   . Asthma Mother   . Allergies Mother   . Hypertension Mother   . Thyroid disease Mother      Current Outpatient Medications on File Prior to Visit  Medication Sig Dispense Refill  . albuterol (PROAIR HFA) 108 (90 Base) MCG/ACT inhaler Inhale 1 puff into the lungs as needed.    Marland Kitchen albuterol (PROVENTIL) (2.5 MG/3ML) 0.083% nebulizer solution     . aspirin EC 81 MG tablet Take 1 tablet (81 mg total) by mouth daily. 30 tablet  3  . BREO ELLIPTA 200-25 MCG/INH AEPB Inhale 1 puff into the lungs daily.    . clotrimazole-betamethasone (LOTRISONE) cream Apply 1 application topically 2 (two) times daily.    Marland Kitchen. conjugated estrogens (PREMARIN) vaginal cream Place 1/2 gram vaginally at hs x2 weeks, and then 1/2 gram vaginally at hs two to three times a week. 30 g 3  . desloratadine (CLARINEX) 5 MG tablet Take 5 mg by mouth daily.      Marland Kitchen. diltiazem (CARDIZEM CD) 120 MG 24 hr capsule Take 1 capsule (120 mg total) by mouth daily. 30 capsule 3  . pravastatin (PRAVACHOL) 40 MG tablet 1/4 tablet daily    . rosuvastatin (CRESTOR) 20 MG tablet Take 1 tablet (20 mg total) by mouth daily. 30 tablet 3  . sertraline (ZOLOFT) 25 MG tablet Take 1 tablet by mouth as needed.    . triamterene-hydrochlorothiazide (DYAZIDE) 37.5-25 MG capsule Take 1 capsule by mouth every other day.     No current facility-administered medications on file prior to visit.      Cardiovascular studies:  Echocardiogram 08/05/2019: Left ventricle cavity is normal in size. Mild concentric hypertrophy of the left ventricle. Hyperdynamic global wall motion. Normal LV systolic function with visual EF 65-70%. Doppler evidence of grade I (impaired) diastolic dysfunction, normal LAP.  No significant valvular abnormality.  Normal right atrial pressure.   CTA head/neck 08/05/2019: Normal CTA of the head and neck.  EKG 07/18/2019: Sinus rhythm 98 bpm.  Nonspecific ST depression.  EKG 05/31/2019: Sinus rhythm 80 bpm.  Nonspecific ST-T abnormality.  Recent labs: 05/15/2019: H/H 16/48.  MCV 85.  Platelets 295. Cholesterol 261, triglycerides 139, HDL 54, LDL 179  Review of Systems  Constitution: Negative for decreased appetite, malaise/fatigue, weight gain and weight loss.  HENT: Positive for hoarse voice and sore throat. Negative for congestion.   Eyes: Negative for visual disturbance.  Cardiovascular: Positive for palpitations. Negative for chest pain, dyspnea on exertion, leg swelling and syncope.  Endocrine: Negative for cold intolerance.  Hematologic/Lymphatic: Does not bruise/bleed easily.  Skin: Negative for itching and rash.  Musculoskeletal: Negative for myalgias.  Gastrointestinal: Negative for abdominal pain, nausea and vomiting.  Genitourinary: Negative for dysuria.  Neurological: Positive for numbness and paresthesias. Negative for dizziness and weakness.  Psychiatric/Behavioral: The patient is not nervous/anxious.   All other systems reviewed and are negative.        Vitals:   08/26/19 1457  BP: (!) 121/59  Pulse: 82  Temp: 98 F (36.7 C)  SpO2: 95%     Body mass index is 25.79 kg/m. Filed Weights   08/26/19 1457  Weight: 155 lb (70.3 kg)     Objective:   Physical Exam  Constitutional: She is oriented to person, place, and time. She appears well-developed and well-nourished. No distress.  HENT:  Head: Normocephalic and atraumatic.   Eyes: Pupils are equal, round, and reactive to light. Conjunctivae are normal.  Neck: No JVD present.  Cardiovascular: Normal rate, regular rhythm and intact distal pulses.  Pulmonary/Chest: Effort normal and breath sounds normal. She has no wheezes. She has no rales.  Abdominal: Soft. Bowel sounds are normal. There is no rebound.  Musculoskeletal:        General: No edema.  Lymphadenopathy:    She has no cervical adenopathy.  Neurological: She is alert and oriented to person, place, and time. No cranial nerve deficit.  Skin: Skin is warm and dry.  Psychiatric: She has a normal mood and affect.  Nursing note  and vitals reviewed.         Assessment & Recommendations:   58 y.o. Caucasian female with hypertension, hyperlipidemia, asthma, anxiety, palpitations, tingling, numbness.  Hyperlipidemia: Increase Crestor to 40 mg if LDL still elevated >120.  Hypertension: Controlled.   All other symptoms have resolved. CT imaging was normal.  F/u in 6 months after lipid panel.   Nigel Mormon, MD [facility] [place] Pager: [phone] Office: [phone] If no answer Cell [phone]

## 2019-09-04 ENCOUNTER — Telehealth: Payer: Self-pay | Admitting: Obstetrics and Gynecology

## 2019-09-04 DIAGNOSIS — Z01419 Encounter for gynecological examination (general) (routine) without abnormal findings: Secondary | ICD-10-CM

## 2019-09-04 NOTE — Telephone Encounter (Signed)
Placed call back to pt. Pt updated on Prempro. Will wait for Dr Quincy Simmonds to hear back from Cardiologist for approval.

## 2019-09-04 NOTE — Telephone Encounter (Signed)
Patient left a message eon the answering machine. She recently saw her cardiologist and was cleared to restart Prempro. She stated her recent visit with her cardiologist is in epic.

## 2019-09-04 NOTE — Telephone Encounter (Signed)
Spoke with pt. Pt states saw Dr Virgina Jock and was cleared to be able to take Prempro again. Would like this Rx because "tired of having night sweats since August" Pt wanted to update Dr Quincy Simmonds on cardio visit on 08/26/19. 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 States still taking Crestor.   Med refill request: Prempro Last AEX: 05/29/19 Next AEX: not scheduled  Last MMG (if hormonal med) 05/13/2019 BIRADS 1, Negative Refill authorized: #90, 3RF, ordered pended if approved.

## 2019-09-04 NOTE — Telephone Encounter (Signed)
Please let patient know that I have reached out to her cardiologist regarding restarting the PremPro.   I did not see anything in the office note pertaining to this.   I will be in touch as soon as I hear back.  Happy Thanksgiving to her!

## 2019-09-09 MED ORDER — PREMPRO 0.45-1.5 MG PO TABS: 1.0000 | ORAL_TABLET | Freq: Every day | ORAL | 2 refills | Status: DC

## 2019-09-09 NOTE — Telephone Encounter (Signed)
Please inform patient that I am refilling her PremPro until her annual exam is due.

## 2019-09-09 NOTE — Telephone Encounter (Signed)
-----   Message from [facility], MD sent at 09/08/2019  5:27 AM EST ----- Regarding: RE: Resumption of hormone replacement therapy Thank you for your message. I do not see any contraindication to use hormonal replacement therapy, given benefits of such outweighs cardiac risks from HRT-which are low.  Thanks Manish  Manish Esther Hardy, MD [facility] [place] Pager: [phone] Office: [phone]     ----- Message ----- From: Nunzio Cobbs, MD Sent: 09/04/2019  12:22 PM EST To: Brook Oletta Lamas, MD, # Subject: Resumption of hormone replacement therapy      Hello Dr. Virgina Jock,   I am contacting you regarding my patient's request to restart her hormone replacement therapy.  I recommended discontinuation of her hormones in August when she presented with facial numbness in the presence of hypertension and hyperlipidemia.   She is requesting to restart her hormone therapy.   Do you see cardiovascular contraindications for her to do this? Thank you and Happy Thanksgiving!  Josefa Half, MD[facility]  Cc- Me

## 2019-10-18 NOTE — Telephone Encounter (Signed)
Okay to close this encounter? °

## 2019-10-19 NOTE — Telephone Encounter (Signed)
Encounter reviewed and closed.  

## 2020-01-01 ENCOUNTER — Encounter: Payer: Self-pay | Admitting: Pulmonary Disease

## 2020-01-01 ENCOUNTER — Ambulatory Visit: Payer: BC Managed Care – PPO | Admitting: Pulmonary Disease

## 2020-01-01 ENCOUNTER — Other Ambulatory Visit: Payer: Self-pay

## 2020-01-01 VITALS — BP 132/90 | HR 64 | Ht 65.0 in | Wt 159.2 lb

## 2020-01-01 DIAGNOSIS — R0602 Shortness of breath: Secondary | ICD-10-CM | POA: Diagnosis not present

## 2020-01-01 DIAGNOSIS — Z8616 Personal history of COVID-19: Secondary | ICD-10-CM

## 2020-01-01 DIAGNOSIS — R062 Wheezing: Secondary | ICD-10-CM

## 2020-01-01 DIAGNOSIS — J302 Other seasonal allergic rhinitis: Secondary | ICD-10-CM | POA: Diagnosis not present

## 2020-01-01 DIAGNOSIS — J4550 Severe persistent asthma, uncomplicated: Secondary | ICD-10-CM | POA: Diagnosis not present

## 2020-01-01 LAB — CBC WITH DIFFERENTIAL/PLATELET
Basophils Absolute: 0 10*3/uL (ref 0.0–0.1)
Basophils Relative: 0.4 % (ref 0.0–3.0)
Eosinophils Absolute: 0.2 10*3/uL (ref 0.0–0.7)
Eosinophils Relative: 2 % (ref 0.0–5.0)
HCT: 47.1 % — ABNORMAL HIGH (ref 36.0–46.0)
Hemoglobin: 15.6 g/dL — ABNORMAL HIGH (ref 12.0–15.0)
Lymphocytes Relative: 28.4 % (ref 12.0–46.0)
Lymphs Abs: 3.3 10*3/uL (ref 0.7–4.0)
MCHC: 33.2 g/dL (ref 30.0–36.0)
MCV: 87.4 fl (ref 78.0–100.0)
Monocytes Absolute: 0.8 10*3/uL (ref 0.1–1.0)
Monocytes Relative: 7.3 % (ref 3.0–12.0)
Neutro Abs: 7.1 10*3/uL (ref 1.4–7.7)
Neutrophils Relative %: 61.9 % (ref 43.0–77.0)
Platelets: 263 10*3/uL (ref 150.0–400.0)
RBC: 5.39 Mil/uL — ABNORMAL HIGH (ref 3.87–5.11)
RDW: 13.4 % (ref 11.5–15.5)
WBC: 11.5 10*3/uL — ABNORMAL HIGH (ref 4.0–10.5)

## 2020-01-01 MED ORDER — TRELEGY ELLIPTA 200-62.5-25 MCG/INH IN AEPB: 1.0000 | INHALATION_SPRAY | Freq: Every day | RESPIRATORY_TRACT | 0 refills | Status: DC

## 2020-01-01 MED ORDER — MONTELUKAST SODIUM 10 MG PO TABS: 10.0000 mg | ORAL_TABLET | Freq: Every day | ORAL | 11 refills | Status: DC

## 2020-01-01 NOTE — Addendum Note (Signed)
Addended by: Wyvonne Lenz on: 01/01/2020 10:00 AM   Modules accepted: Orders

## 2020-01-01 NOTE — Progress Notes (Signed)
Synopsis: Referred in March 2021 for asthma by [name], MD  Subjective:   PATIENT ID: Carol Acosta GENDER: female DOB: 12/11/1960, MRN: [id number]  Chief Complaint  Patient presents with  . Consult    Pt states she has had asthma since birth. Pt has problems with SOB, tightness in chest, wheezing when she has a flare up with her asthma which happens multiple times a year.     This is a 59 year old female past medical history of asthma, hypertension hyperlipidemia, allergic rhinitis.  Patient presents to the office for evaluation of asthma. She has had asthma since child. She has had allergy skin testing with multiple positivities. She had September covid mild symptoms, dx sept 3rd. She always gets flare ups in aug, September, again with issues in the spring time. She has seen cardiology for management of HTN and HLD. She retired Nov 1st. Her anxiety is much better down. She still having recurrent flares. She is married, twin 83 years olds, and 58 year old. Currently managed with Breo 200, was on prednisone three times this past year.   OV 01/01/2020: Patient with ongoing respiratory symptoms.  Very anxious today.  She has been trying to work out and start trying to take care of herself.  We talked about various other medications that she has been using.  She uses antihistamines on occasion.  She has been on Singulair in the past but not currently.  Seldom uses her nebulizer or albuterol.  Even though she does state that she probably should when she has symptoms but she does not always reach to use her albuterol.     Past Medical History:  Diagnosis Date  . Allergic rhinitis   . Asthma   . Hyperlipidemia   . Hypertension   . Osteopenia      Family History  Problem Relation Age of Onset  . COPD Maternal Grandmother   . Hypertension Maternal Grandmother   . Allergic rhinitis Brother   . Dementia Brother   . Allergic rhinitis Brother   . Asthma Mother   . Allergies Mother    . Hypertension Mother   . Thyroid disease Mother      Past Surgical History:  Procedure Laterality Date  . CESAREAN SECTION  2000  . ENDOMETRIAL ABLATION    . NASAL SINUS SURGERY  1989    Social History   Socioeconomic History  . Marital status: Married    Spouse name: Not on file  . Number of children: 3  . Years of education: Not on file  . Highest education level: Not on file  Occupational History  . 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 Smoking status: Never Smoker  . Smokeless tobacco: Never Used  Substance and Sexual Activity  . Alcohol use: Not Currently  . Drug use: No  . Sexual activity: Not Currently    Birth control/protection: None  Other Topics Concern  . Not on file  Social History Narrative  . Not on file   Social Determinants of Health   Financial Resource Strain:   . Difficulty of Paying Living Expenses:   Food Insecurity:   . Worried About Programme researcher, broadcasting/film/video in the Last Year:   . Barista in the Last Year:   Transportation Needs:   . Freight forwarder (Medical):   Marland Kitchen Lack of Transportation (Non-Medical):   Physical Activity:   . Days of Exercise per Week:   . Minutes of Exercise per  Session:   Stress:   . Feeling of Stress :   Social Connections:   . Frequency of Communication with Friends and Family:   . Frequency of Social Gatherings with Friends and Family:   . Attends Religious Services:   . Active Member of Clubs or Organizations:   . Attends Archivist Meetings:   Marland Kitchen Marital Status:   Intimate Partner Violence:   . Fear of Current or Ex-Partner:   . Emotionally Abused:   Marland Kitchen Physically Abused:   . Sexually Abused:      No Known Allergies   Outpatient Medications Prior to Visit  Medication Sig Dispense Refill  . albuterol (PROAIR HFA) 108 (90 Base) MCG/ACT inhaler Inhale 1 puff into the lungs as needed.    Marland Kitchen albuterol (PROVENTIL) (2.5 MG/3ML) 0.083% nebulizer solution     . aspirin EC 81 MG tablet  Take 1 tablet (81 mg total) by mouth daily. 30 tablet 3  . BREO ELLIPTA 200-25 MCG/INH AEPB Inhale 1 puff into the lungs daily.    . clotrimazole-betamethasone (LOTRISONE) cream Apply 1 application topically as needed.     . desloratadine (CLARINEX) 5 MG tablet Take 5 mg by mouth as needed.     . diltiazem (CARDIZEM LA) 120 MG 24 hr tablet Take 120 mg by mouth daily.    . ergocalciferol (VITAMIN D2) 1.25 MG (50000 UT) capsule Vitamin D2 1,250 mcg (50,000 unit) capsule    . estrogen, conjugated,-medroxyprogesterone (PREMPRO) 0.45-1.5 MG tablet Take 1 tablet by mouth daily. 90 tablet 2  . rosuvastatin (CRESTOR) 20 MG tablet Take 20 mg by mouth daily.    . sertraline (ZOLOFT) 25 MG tablet Take 1 tablet by mouth as needed.    . triamterene-hydrochlorothiazide (DYAZIDE) 37.5-25 MG capsule Take 1 capsule by mouth daily.     Marland Kitchen conjugated estrogens (PREMARIN) vaginal cream Place 1/2 gram vaginally at hs x2 weeks, and then 1/2 gram vaginally at hs two to three times a week. (Patient not taking: Reported on 01/01/2020) 30 g 3  . diltiazem (CARDIZEM CD) 120 MG 24 hr capsule Take 1 capsule (120 mg total) by mouth daily. 30 capsule 3  . rosuvastatin (CRESTOR) 20 MG tablet Take 1 tablet (20 mg total) by mouth daily. 30 tablet 3   No facility-administered medications prior to visit.    Review of Systems  Constitutional: Negative for chills, fever, malaise/fatigue and weight loss.  HENT: Negative for hearing loss, sore throat and tinnitus.   Eyes: Negative for blurred vision and double vision.  Respiratory: Positive for cough, shortness of breath and wheezing. Negative for hemoptysis, sputum production and stridor.   Cardiovascular: Negative for chest pain, palpitations, orthopnea, leg swelling and PND.  Gastrointestinal: Negative for abdominal pain, constipation, diarrhea, heartburn, nausea and vomiting.  Genitourinary: Negative for dysuria, hematuria and urgency.  Musculoskeletal: Negative for joint pain  and myalgias.  Skin: Negative for itching and rash.  Neurological: Negative for dizziness, tingling, weakness and headaches.  Endo/Heme/Allergies: Negative for environmental allergies. Does not bruise/bleed easily.  Psychiatric/Behavioral: Negative for depression. The patient is not nervous/anxious and does not have insomnia.   All other systems reviewed and are negative.    Objective:  Physical Exam Vitals reviewed.  Constitutional:      General: She is not in acute distress.    Appearance: She is well-developed.  HENT:     Head: Normocephalic and atraumatic.  Eyes:     General: No scleral icterus.    Conjunctiva/sclera: Conjunctivae normal.  Pupils: Pupils are equal, round, and reactive to light.  Neck:     Vascular: No JVD.     Trachea: No tracheal deviation.  Cardiovascular:     Rate and Rhythm: Normal rate and regular rhythm.     Heart sounds: Normal heart sounds. No murmur.  Pulmonary:     Effort: Pulmonary effort is normal. No tachypnea, accessory muscle usage or respiratory distress.     Breath sounds: No stridor. No wheezing, rhonchi or rales.  Abdominal:     General: Bowel sounds are normal. There is no distension.     Palpations: Abdomen is soft.     Tenderness: There is no abdominal tenderness.  Musculoskeletal:        General: No tenderness.     Cervical back: Neck supple.  Lymphadenopathy:     Cervical: No cervical adenopathy.  Skin:    General: Skin is warm and dry.     Capillary Refill: Capillary refill takes less than 2 seconds.     Findings: No rash.  Neurological:     Mental Status: She is alert and oriented to person, place, and time.  Psychiatric:        Behavior: Behavior normal.      Vitals:   01/01/20 0923  BP: 132/90  Pulse: 64  SpO2: 97%  Weight: 159 lb 3.2 oz (72.2 kg)  Height: 5\' 5"  (1.651 m)   97% on RA BMI Readings from Last 3 Encounters:  01/01/20 26.49 kg/m  08/26/19 25.79 kg/m  07/18/19 23.18 kg/m   Wt Readings  from Last 3 Encounters:  01/01/20 159 lb 3.2 oz (72.2 kg)  08/26/19 155 lb (70.3 kg)  07/18/19 148 lb (67.1 kg)     CBC No results found for: WBC, RBC, HGB, HCT, PLT, MCV, MCH, MCHC, RDW, LYMPHSABS, MONOABS, EOSABS, BASOSABS   Chest Imaging: 05/24/2011 chest x-ray: Evidence of hyperinflation no infiltrate.  Pulmonary Functions Testing Results: No flowsheet data found.  FeNO: None  Pathology: None   Echocardiogram:  08/05/2019: Normal ejection fraction 65 to 70%, grade 1 diastolic dysfunction.  Heart Catheterization: None     Assessment & Plan:     ICD-10-CM   1. Severe persistent asthma without complication  J45.50 Pulmonary Function Test  2. SOB (shortness of breath)  R06.02   3. Wheezing  R06.2   4. Seasonal allergies  J30.2   5. History of COVID-19  Z86.16     Discussion:  59 year old female history of asthma since childhood, recurrent exacerbations, 3 times being on the prednisone andreceiving a steroid shot, history of COVID-19.  She was last on prednisone approximately 3 weeks ago.  Assessment:   Recurrent exacerbations of asthma requiring intervention, attempting to prevent hospitalization regarding recurrent symptomatology.  Plan Following Extensive Data Review & Interpretation:  . I reviewed prior external note(s) from 08/26/2019 cardiology office visit Dr. 08/28/2019 . I reviewed the result(s) of December 2020, COVID-19 negative, April 2009 intradermal skin testing positive for multiple grasses, trees, weeds, molds. . I have ordered full pulmonary function test.  We will also order blood work today, CBC with differential plus regional allergy panel plus IgE.  Start Trelegy 200 Albuterol as needed shortness of breath and wheezing Singulair 10 mg daily Antihistamine, OTC  Patient has continued symptoms despite antihistamine plus Singulair plus Trelegy would consider adding an additional ICS for double ICS coverage.  If still having symptoms and does have  significant positive blood work biomarkers for atopic asthma features with consider biologic initiation.  Independent interpretation of tests . Review of patient's May 24, 2011 chest x-ray images revealed hyperinflated lung fields no infiltrate. The patient's images have been independently reviewed by me.    Return to clinic in 6 weeks.    Current Outpatient Medications:  .  albuterol (PROAIR HFA) 108 (90 Base) MCG/ACT inhaler, Inhale 1 puff into the lungs as needed., Disp: , Rfl:  .  albuterol (PROVENTIL) (2.5 MG/3ML) 0.083% nebulizer solution, , Disp: , Rfl:  .  aspirin EC 81 MG tablet, Take 1 tablet (81 mg total) by mouth daily., Disp: 30 tablet, Rfl: 3 .  BREO ELLIPTA 200-25 MCG/INH AEPB, Inhale 1 puff into the lungs daily., Disp: , Rfl:  .  clotrimazole-betamethasone (LOTRISONE) cream, Apply 1 application topically as needed. , Disp: , Rfl:  .  desloratadine (CLARINEX) 5 MG tablet, Take 5 mg by mouth as needed. , Disp: , Rfl:  .  diltiazem (CARDIZEM LA) 120 MG 24 hr tablet, Take 120 mg by mouth daily., Disp: , Rfl:  .  ergocalciferol (VITAMIN D2) 1.25 MG (50000 UT) capsule, Vitamin D2 1,250 mcg (50,000 unit) capsule, Disp: , Rfl:  .  estrogen, conjugated,-medroxyprogesterone (PREMPRO) 0.45-1.5 MG tablet, Take 1 tablet by mouth daily., Disp: 90 tablet, Rfl: 2 .  rosuvastatin (CRESTOR) 20 MG tablet, Take 20 mg by mouth daily., Disp: , Rfl:  .  sertraline (ZOLOFT) 25 MG tablet, Take 1 tablet by mouth as needed., Disp: , Rfl:  .  triamterene-hydrochlorothiazide (DYAZIDE) 37.5-25 MG capsule, Take 1 capsule by mouth daily. , Disp: , Rfl:  .  conjugated estrogens (PREMARIN) vaginal cream, Place 1/2 gram vaginally at hs x2 weeks, and then 1/2 gram vaginally at hs two to three times a week. (Patient not taking: Reported on 01/01/2020), Disp: 30 g, Rfl: 3 .  diltiazem (CARDIZEM CD) 120 MG 24 hr capsule, Take 1 capsule (120 mg total) by mouth daily., Disp: 30 capsule, Rfl: 3 .  rosuvastatin  (CRESTOR) 20 MG tablet, Take 1 tablet (20 mg total) by mouth daily., Disp: 30 tablet, Rfl: 3   Josephine Igo, DO [facility] 01/01/2020 9:42 AM

## 2020-01-01 NOTE — Addendum Note (Signed)
Addended by: Wyvonne Lenz on: 01/01/2020 10:31 AM   Modules accepted: Orders

## 2020-01-01 NOTE — Patient Instructions (Addendum)
Thank you for visiting Dr. Tonia Brooms at [facility]. Today we recommend the following:  Orders Placed This Encounter  Procedures  . Pulmonary Function Test   RAST Testing  CBC w/ Diff  Trelegy 200 mcg  Continue albuterol as needed Ok to use nebulizer    Return in about 6 weeks (around 02/12/2020) for w/ [name] Parrett.    Please do your part to reduce the spread of COVID-19.

## 2020-01-01 NOTE — Addendum Note (Signed)
Addended by: Demetrio Lapping E on: 01/01/2020 09:57 AM   Modules accepted: Orders

## 2020-01-02 LAB — RESPIRATORY ALLERGY PANEL REGION II W/ RFLX: ~~LOC~~
Allergen, A. alternata, m6: <0.1 kU/L
Allergen, Cedar tree, t12: <0.1 kU/L
Allergen, Comm Silver Birch, t9: 8.1 kU/L — ABNORMAL HIGH
Allergen, Cottonwood, t14: 0.11 kU/L — ABNORMAL HIGH
Allergen, D pternoyssinus,d7: 3.3 kU/L — ABNORMAL HIGH
Allergen, Mouse Urine Protein, e78: <0.1 kU/L
Allergen, Mulberry, t76: 0.11 kU/L — ABNORMAL HIGH
Allergen, Oak,t7: 5.41 kU/L — ABNORMAL HIGH
Allergen, P. notatum, m1: <0.1 kU/L
Aspergillus fumigatus, m3: <0.1 kU/L
Bermuda Grass: 0.12 kU/L — ABNORMAL HIGH
Box Elder IgE: 0.2 kU/L — ABNORMAL HIGH
CLADOSPORIUM HERBARUM (M2) IGE: <0.1 kU/L
COMMON RAGWEED (SHORT) (W1) IGE: 0.17 kU/L — ABNORMAL HIGH
Cat Dander: 0.5 kU/L — ABNORMAL HIGH
Class: 0
Class: 0
Class: 0
Class: 0
Class: 0
Class: 0
Class: 0
Class: 0
Class: 0
Class: 0
Class: 0
Class: 1
Class: 1
Class: 2
Class: 3
Class: 3
Class: 3
Class: 3
Cockroach: <0.1 kU/L
D. farinae: 3.57 kU/L — ABNORMAL HIGH
Dog Dander: 16.4 kU/L — ABNORMAL HIGH
Elm IgE: 0.19 kU/L — ABNORMAL HIGH
IgE (Immunoglobulin E), Serum: 91 kU/L
Johnson Grass: <0.1 kU/L
Pecan/Hickory Tree IgE: 0.45 kU/L — ABNORMAL HIGH
Rough Pigweed  IgE: <0.1 kU/L
Sheep Sorrel IgE: <0.1 kU/L
Timothy Grass: <0.1 kU/L

## 2020-01-02 LAB — INTERPRETATION:

## 2020-01-07 ENCOUNTER — Other Ambulatory Visit: Payer: Self-pay | Admitting: Cardiology

## 2020-01-07 DIAGNOSIS — I10 Essential (primary) hypertension: Secondary | ICD-10-CM

## 2020-01-07 DIAGNOSIS — R002 Palpitations: Secondary | ICD-10-CM

## 2020-01-20 ENCOUNTER — Telehealth: Payer: Self-pay | Admitting: Pulmonary Disease

## 2020-01-20 NOTE — Telephone Encounter (Signed)
Spoke with pt, advised results from Dr. Tonia Brooms. She is also having thrush from the Trelegy and back pain so she is a little concerned about taking the Trelegy. She does breathe better but she is wanting to go back to [place] until she sees you. FYI Dr, Tonia Brooms.   Robynn Pane, you can let her know that she has significantly positive allergy panel consistent with her asthma history. Hopefully her symptoms are better controlled now. I will see her in follow up.   Thanks,  BLI  Josephine Igo, DO [facility] 01/15/2020 5:40 PM

## 2020-01-20 NOTE — Telephone Encounter (Signed)
Ok thanks for letting me know.  BLI

## 2020-01-24 ENCOUNTER — Other Ambulatory Visit: Payer: Self-pay

## 2020-01-24 ENCOUNTER — Encounter: Payer: Self-pay | Admitting: Obstetrics and Gynecology

## 2020-01-24 ENCOUNTER — Ambulatory Visit: Payer: BC Managed Care – PPO | Admitting: Obstetrics and Gynecology

## 2020-01-24 VITALS — BP 140/76 | HR 80 | Temp 97.3°F | Resp 10 | Ht 65.0 in | Wt 163.0 lb

## 2020-01-24 DIAGNOSIS — N952 Postmenopausal atrophic vaginitis: Secondary | ICD-10-CM | POA: Diagnosis not present

## 2020-01-24 DIAGNOSIS — N898 Other specified noninflammatory disorders of vagina: Secondary | ICD-10-CM | POA: Diagnosis not present

## 2020-01-24 DIAGNOSIS — R350 Frequency of micturition: Secondary | ICD-10-CM

## 2020-01-24 DIAGNOSIS — R3915 Urgency of urination: Secondary | ICD-10-CM

## 2020-01-24 LAB — POCT URINALYSIS DIPSTICK
Bilirubin, UA: NEGATIVE
Blood, UA: NEGATIVE
Glucose, UA: NEGATIVE
Ketones, UA: NEGATIVE
Leukocytes, UA: NEGATIVE
Nitrite, UA: NEGATIVE
Protein, UA: NEGATIVE
Urobilinogen, UA: 0.2 U/dL
pH, UA: 8

## 2020-01-24 MED ORDER — PHENAZOPYRIDINE HCL 200 MG PO TABS: 200.0000 mg | ORAL_TABLET | Freq: Three times a day (TID) | ORAL | 0 refills | Status: DC | PRN

## 2020-01-24 MED ORDER — SULFAMETHOXAZOLE-TRIMETHOPRIM 800-160 MG PO TABS: 1.0000 | ORAL_TABLET | Freq: Two times a day (BID) | ORAL | 0 refills | Status: DC

## 2020-01-24 NOTE — Progress Notes (Signed)
GYNECOLOGY  VISIT   HPI: 59 y.o.   Married White or Caucasian Not Hispanic or Latino  female   G2P2 with No LMP recorded. Patient has had an ablation.   here for UTI. Patient was switched from [facility] to Trelegy. Per patient, she developed thrush and uti symptoms as side effects of the Trelegy Ellipta. Patient has now stopped taking Trelegy and restarted Breo Ellipta.  She c/o tingling in her urethra, started 5 days ago. She has urinary frequency and urgency, not sure it's worse than her baseline. Constant sensation that she needs to void. She tried AZO this week and it did help.  She is on prempro, stopped her vaginal dryness. No discharge, no itching.  Not sexually active.  No flank pain, no fever.   GYNECOLOGIC HISTORY: No LMP recorded. Patient has had an ablation. Contraception:Ablation Menopausal hormone therapy: Prempro        OB History    Gravida  2   Para  2   Term      Preterm      AB      Living  3     SAB      TAB      Ectopic      Multiple  1   Live Births                 Patient Active Problem List   Diagnosis Date Noted  . Essential hypertension 07/20/2019  . Mixed hyperlipidemia 07/20/2019  . Paresthesia 07/20/2019  . Exertional dyspnea 07/20/2019  . Palpitations 07/18/2019  . Asthma, intrinsic 05/24/2011    Past Medical History:  Diagnosis Date  . Allergic rhinitis   . Asthma   . Hyperlipidemia   . Hypertension   . Osteopenia     Past Surgical History:  Procedure Laterality Date  . CESAREAN SECTION  2000  . ENDOMETRIAL ABLATION    . NASAL SINUS SURGERY  1989    Current Outpatient Medications  Medication Sig Dispense Refill  . albuterol (PROAIR HFA) 108 (90 Base) MCG/ACT inhaler Inhale 1 puff into the lungs as needed.    Marland Kitchen albuterol (PROVENTIL) (2.5 MG/3ML) 0.083% nebulizer solution     . aspirin EC 81 MG tablet Take 1 tablet (81 mg total) by mouth daily. 30 tablet 3  . BREO ELLIPTA 200-25 MCG/INH AEPB Inhale 1 puff into  the lungs daily.    . clotrimazole-betamethasone (LOTRISONE) cream Apply 1 application topically as needed.     . desloratadine (CLARINEX) 5 MG tablet Take 5 mg by mouth as needed.     . diltiazem (CARDIZEM CD) 120 MG 24 hr capsule TAKE (1) CAPSULE DAILY. 30 capsule 1  . ergocalciferol (VITAMIN D2) 1.25 MG (50000 UT) capsule Vitamin D2 1,250 mcg (50,000 unit) capsule    . estrogen, conjugated,-medroxyprogesterone (PREMPRO) 0.45-1.5 MG tablet Take 1 tablet by mouth daily. 90 tablet 2  . montelukast (SINGULAIR) 10 MG tablet Take 1 tablet (10 mg total) by mouth at bedtime. 30 tablet 11  . rosuvastatin (CRESTOR) 20 MG tablet Take 20 mg by mouth daily.    . sertraline (ZOLOFT) 25 MG tablet Take 1 tablet by mouth as needed.    . triamterene-hydrochlorothiazide (DYAZIDE) 37.5-25 MG capsule Take 1 capsule by mouth daily.     Marland Kitchen conjugated estrogens (PREMARIN) vaginal cream Place 1/2 gram vaginally at hs x2 weeks, and then 1/2 gram vaginally at hs two to three times a week. (Patient not taking: Reported on 01/01/2020) 30 g  3  . Fluticasone-Umeclidin-Vilant (TRELEGY ELLIPTA) 200-62.5-25 MCG/INH AEPB Inhale 1 puff into the lungs daily. (Patient not taking: Reported on 01/24/2020) 14 each 0  . rosuvastatin (CRESTOR) 20 MG tablet Take 1 tablet (20 mg total) by mouth daily. 30 tablet 3   No current facility-administered medications for this visit.     ALLERGIES: Patient has no known allergies.  Family History  Problem Relation Age of Onset  . COPD Maternal Grandmother   . Hypertension Maternal Grandmother   . Allergic rhinitis Brother   . Dementia Brother   . Allergic rhinitis Brother   . Asthma Mother   . Allergies Mother   . Hypertension Mother   . Thyroid disease Mother     Social History   Socioeconomic History  . Marital status: Married    Spouse name: Not on file  . Number of children: 3  . Years of education: Not on file  . Highest education level: Not on file  Occupational History  .  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 Smoking status: Never Smoker  . Smokeless tobacco: Never Used  Substance and Sexual Activity  . Alcohol use: Not Currently  . Drug use: No  . Sexual activity: Not Currently    Birth control/protection: None  Other Topics Concern  . Not on file  Social History Narrative  . Not on file   Social Determinants of Health   Financial Resource Strain:   . Difficulty of Paying Living Expenses:   Food Insecurity:   . Worried About Charity fundraiser in the Last Year:   . Arboriculturist in the Last Year:   Transportation Needs:   . Film/video editor (Medical):   Marland Kitchen Lack of Transportation (Non-Medical):   Physical Activity:   . Days of Exercise per Week:   . Minutes of Exercise per Session:   Stress:   . Feeling of Stress :   Social Connections:   . Frequency of Communication with Friends and Family:   . Frequency of Social Gatherings with Friends and Family:   . Attends Religious Services:   . Active Member of Clubs or Organizations:   . Attends Archivist Meetings:   Marland Kitchen Marital Status:   Intimate Partner Violence:   . Fear of Current or Ex-Partner:   . Emotionally Abused:   Marland Kitchen Physically Abused:   . Sexually Abused:     Review of Systems  Constitutional: Negative.   HENT: Negative.   Eyes: Negative.   Respiratory: Negative.   Cardiovascular: Negative.   Gastrointestinal: Negative.   Genitourinary: Positive for frequency and urgency.       Discomfort with urination  Musculoskeletal: Negative.   Skin: Negative.   Neurological: Negative.   Endo/Heme/Allergies: Negative.   Psychiatric/Behavioral: Negative.     PHYSICAL EXAMINATION:    BP 140/76 (BP Location: Left Arm, Patient Position: Sitting, Cuff Size: Normal)   Pulse 80   Temp (!) 97.3 F (36.3 C) (Temporal)   Resp 10   Ht 5\' 5"  (1.651 m)   Wt 163 lb (73.9 kg)   BMI 27.12 kg/m     General appearance: alert, cooperative and appears stated age CVA:  not tender Abdomen: soft, mildly tender in the SP region,  non distended, no masses,  no organomegaly  Pelvic: External genitalia:  no lesions              Urethra:  normal appearing urethra with no masses, tenderness or lesions  Bartholins and Skenes: normal                 Vagina:mildly atrophic appearing vagina with normal color and discharge, no lesions              Cervix: no lesions               Chaperone was present for exam.  Urine dip: negative  Wet prep: ? clue, no  trich, ++ wbc KOH: no yeast PH: 4.5   ASSESSMENT Vague UTI symptoms, symptoms did improve with AZO ? Clue on wet prep Mild vaginal atrophy Tingling ? Urethra or vaginal Mild vaginal atrophy, she will restart her premarin cream 2 x a week. Also discussed vaginal moisturizer.     PLAN Send urine for ua, c&s Send nuswab Given improvement in symptoms on AZO, will treat with Bactrim and pyridium Call with any concerns

## 2020-01-24 NOTE — Patient Instructions (Addendum)
Urinary Tract Infection, Adult  A urinary tract infection (UTI) is an infection of any part of the urinary tract. The urinary tract includes the kidneys, ureters, bladder, and urethra. These organs make, store, and get rid of urine in the body. Your health care provider may use other names to describe the infection. An upper UTI affects the ureters and kidneys (pyelonephritis). A lower UTI affects the bladder (cystitis) and urethra (urethritis). What are the causes? Most urinary tract infections are caused by bacteria in your genital area, around the entrance to your urinary tract (urethra). These bacteria grow and cause inflammation of your urinary tract. What increases the risk? You are more likely to develop this condition if:  You have a urinary catheter that stays in place (indwelling).  You are not able to control when you urinate or have a bowel movement (you have incontinence).  You are female and you: ? Use a spermicide or diaphragm for birth control. ? Have low estrogen levels. ? Are pregnant.  You have certain genes that increase your risk (genetics).  You are sexually active.  You take antibiotic medicines.  You have a condition that causes your flow of urine to slow down, such as: ? An enlarged prostate, if you are female. ? Blockage in your urethra (stricture). ? A kidney stone. ? A nerve condition that affects your bladder control (neurogenic bladder). ? Not getting enough to drink, or not urinating often.  You have certain medical conditions, such as: ? Diabetes. ? A weak disease-fighting system (immunesystem). ? Sickle cell disease. ? Gout. ? Spinal cord injury. What are the signs or symptoms? Symptoms of this condition include:  Needing to urinate right away (urgently).  Frequent urination or passing small amounts of urine frequently.  Pain or burning with urination.  Blood in the urine.  Urine that smells bad or unusual.  Trouble urinating.  Cloudy  urine.  Vaginal discharge, if you are female.  Pain in the abdomen or the lower back. You may also have:  Vomiting or a decreased appetite.  Confusion.  Irritability or tiredness.  A fever.  Diarrhea. The first symptom in older adults may be confusion. In some cases, they may not have any symptoms until the infection has worsened. How is this diagnosed? This condition is diagnosed based on your medical history and a physical exam. You may also have other tests, including:  Urine tests.  Blood tests.  Tests for sexually transmitted infections (STIs). If you have had more than one UTI, a cystoscopy or imaging studies may be done to determine the cause of the infections. How is this treated? Treatment for this condition includes:  Antibiotic medicine.  Over-the-counter medicines to treat discomfort.  Drinking enough water to stay hydrated. If you have frequent infections or have other conditions such as a kidney stone, you may need to see a health care provider who specializes in the urinary tract (urologist). In rare cases, urinary tract infections can cause sepsis. Sepsis is a life-threatening condition that occurs when the body responds to an infection. Sepsis is treated in the hospital with IV antibiotics, fluids, and other medicines. Follow these instructions at home:  Medicines  Take over-the-counter and prescription medicines only as told by your health care provider.  If you were prescribed an antibiotic medicine, take it as told by your health care provider. Do not stop using the antibiotic even if you start to feel better. General instructions  Make sure you: ? Empty your bladder often and   completely. Do not hold urine for long periods of time. ? Empty your bladder after sex.  Vaginitis Vaginitis is a condition in which the vaginal tissue swells and becomes red (inflamed). This condition is most often caused by a change in the normal balance of bacteria and  yeast that live in the vagina. This change causes an overgrowth of certain bacteria or yeast, which causes the inflammation. There are different types of vaginitis, but the most common types are:  Bacterial vaginosis.  Yeast infection (candidiasis).  Trichomoniasis vaginitis. This is a sexually transmitted disease (STD).  Viral vaginitis.  Atrophic vaginitis.  Allergic vaginitis. What are the causes? The cause of this condition depends on the type of vaginitis. It can be caused by:  Bacteria (bacterial vaginosis).  Yeast, which is a fungus (yeast infection).  A parasite (trichomoniasis vaginitis).  A virus (viral vaginitis).  Low hormone levels (atrophic vaginitis). Low hormone levels can occur during pregnancy, breastfeeding, or after menopause.  Irritants, such as bubble baths, scented tampons, and feminine sprays (allergic vaginitis). Other factors can change the normal balance of the yeast and bacteria that live in the vagina. These include:  Antibiotic medicines.  Poor hygiene.  Diaphragms, vaginal sponges, spermicides, birth control pills, and intrauterine devices (IUD).  Sex.  Infection.  Uncontrolled diabetes.  A weakened defense (immune) system. What increases the risk? This condition is more likely to develop in women who:  Smoke.  Use vaginal douches, scented tampons, or scented sanitary pads.  Wear tight-fitting pants.  Wear thong underwear.  Use oral birth control pills or an IUD.  Have sex without a condom.  Have multiple sex partners.  Have an STD.  Frequently use the spermicide nonoxynol-9.  Eat lots of foods high in sugar.  Have uncontrolled diabetes.  Have low estrogen levels.  Have a weakened immune system from an immune disorder or medical treatment.  Are pregnant or breastfeeding. What are the signs or symptoms? Symptoms vary depending on the cause of the vaginitis. Common symptoms include:  Abnormal vaginal  discharge. ? The discharge is white, gray, or yellow with bacterial vaginosis. ? The discharge is thick, white, and cheesy with a yeast infection. ? The discharge is frothy and yellow or greenish with trichomoniasis.  A bad vaginal smell. The smell is fishy with bacterial vaginosis.  Vaginal itching, pain, or swelling.  Sex that is painful.  Pain or burning when urinating. Sometimes there are no symptoms. How is this diagnosed? This condition is diagnosed based on your symptoms and medical history. A physical exam, including a pelvic exam, will also be done. You may also have other tests, including:  Tests to determine the pH level (acidity or alkalinity) of your vagina.  A whiff test, to assess the odor that results when a sample of your vaginal discharge is mixed with a potassium hydroxide solution.  Tests of vaginal fluid. A sample will be examined under a microscope. How is this treated? Treatment varies depending on the type of vaginitis you have. Your treatment may include:  Antibiotic creams or pills to treat bacterial vaginosis and trichomoniasis.  Antifungal medicines, such as vaginal creams or suppositories, to treat a yeast infection.  Medicine to ease discomfort if you have viral vaginitis. Your sexual partner should also be treated.  Estrogen delivered in a cream, pill, suppository, or vaginal ring to treat atrophic vaginitis. If vaginal dryness occurs, lubricants and moisturizing creams may help. You may need to avoid scented soaps, sprays, or douches.  Stopping use  of a product that is causing allergic vaginitis. Then using a vaginal cream to treat the symptoms. Follow these instructions at home: Lifestyle  Keep your genital area clean and dry. Avoid soap, and only rinse the area with water.  Do not douche or use tampons until your health care provider says it is okay to do so. Use sanitary pads, if needed.  Do not have sex until your health care provider  approves. When you can return to sex, practice safe sex and use condoms.  Wipe from front to back. This avoids the spread of bacteria from the rectum to the vagina. General instructions  Take over-the-counter and prescription medicines only as told by your health care provider.  If you were prescribed an antibiotic medicine, take or use it as told by your health care provider. Do not stop taking or using the antibiotic even if you start to feel better.  Keep all follow-up visits as told by your health care provider. This is important. How is this prevented?  Use mild, non-scented products. Do not use things that can irritate the vagina, such as fabric softeners. Avoid the following products if they are scented: ? Feminine sprays. ? Detergents. ? Tampons. ? Feminine hygiene products. ? Soaps or bubble baths.  Let air reach your genital area. ? Wear cotton underwear to reduce moisture buildup. ? Avoid wearing underwear while you sleep. ? Avoid wearing tight pants and underwear or nylons without a cotton panel. ? Avoid wearing thong underwear.  Take off any wet clothing, such as bathing suits, as soon as possible.  Practice safe sex and use condoms. Contact a health care provider if:  You have abdominal pain.  You have a fever.  You have symptoms that last for more than 2-3 days. Get help right away if:  You have a fever and your symptoms suddenly get worse. Summary  Vaginitis is a condition in which the vaginal tissue becomes inflamed.This condition is most often caused by a change in the normal balance of bacteria and yeast that live in the vagina.  Treatment varies depending on the type of vaginitis you have.  Do not douche, use tampons , or have sex until your health care provider approves. When you can return to sex, practice safe sex and use condoms. This information is not intended to replace advice given to you by your health care provider. Make sure you discuss any  questions you have with your health care provider. Document Revised: 09/08/2017 Document Reviewed: 11/01/2016 Elsevier Patient Education  2020 [employer]  Atrophic Vaginitis  Atrophic vaginitis is a condition in which the tissues that line the vagina become dry and thin. This condition is most common in women who have stopped having regular menstrual periods (are in menopause). This usually starts when a woman is 45-52 years old. That is the time when a woman's estrogen levels begin to drop (decrease). Estrogen is a female hormone. It helps to keep the tissues of the vagina moist. It stimulates the vagina to produce a clear fluid that lubricates the vagina for sexual intercourse. This fluid also protects the vagina from infection. Lack of estrogen can cause the lining of the vagina to get thinner and dryer. The vagina may also shrink in size. It may become less elastic. Atrophic vaginitis tends to get worse over time as a woman's estrogen level drops. What are the causes? This condition is caused by the normal drop in estrogen that happens around the time of menopause. What  increases the risk? Certain conditions or situations may lower a woman's estrogen level, leading to a higher risk for atrophic vaginitis. You are more likely to develop this condition if: You are taking medicines that block estrogen. You have had your ovaries removed. You are being treated for cancer with X-ray (radiation) or medicines (chemotherapy). You have given birth or are breastfeeding. You are older than age 48. You smoke. What are the signs or symptoms? Symptoms of this condition include: Pain, soreness, or bleeding during sexual intercourse (dyspareunia). Vaginal burning, irritation, or itching. Pain or bleeding when a speculum is used in a vaginal exam (pelvic exam). Having burning pain when passing urine. Vaginal discharge that is brown or yellow. In some cases, there are no symptoms. How is this  diagnosed? This condition is diagnosed by taking a medical history and doing a physical exam. This will include a pelvic exam that checks the vaginal tissues. Though rare, you may also have other tests, including: A urine test. A test that checks the acid balance in your vagina (acid balance test). How is this treated? Treatment for this condition depends on how severe your symptoms are. Treatment may include: Using an over-the-counter vaginal lubricant before sex. Using a long-acting vaginal moisturizer. Using low-dose vaginal estrogen for moderate to severe symptoms that do not respond to other treatments. Options include creams, tablets, and inserts (vaginal rings). Before you use a vaginal estrogen, tell your health care provider if you have a history of: Breast cancer. Endometrial cancer. Blood clots. If you are not sexually active and your symptoms are very mild, you may not need treatment. Follow these instructions at home: Medicines Take over-the-counter and prescription medicines only as told by your health care provider. Do not use herbal or alternative medicines unless your health care provider says that you can. Use over-the-counter creams, lubricants, or moisturizers for dryness only as directed by your health care provider. General instructions If your atrophic vaginitis is caused by menopause, discuss all of your menopause symptoms and treatment options with your health care provider. Do not douche. Do not use products that can make your vagina dry. These include: Scented feminine sprays. Scented tampons. Scented soaps. Vaginal intercourse can help to improve blood flow and elasticity of vaginal tissue. If it hurts to have sex, try using a lubricant or moisturizer just before having intercourse. Contact a health care provider if: Your discharge looks different than normal. Your vagina has an unusual smell. You have new symptoms. Your symptoms do not improve with  treatment. Your symptoms get worse. Summary Atrophic vaginitis is a condition in which the tissues that line the vagina become dry and thin. It is most common in women who have stopped having regular menstrual periods (are in menopause). Treatment options include using vaginal lubricants and low-dose vaginal estrogen. Contact a health care provider if your vagina has an unusual smell, or if your symptoms get worse or do not improve after treatment. This information is not intended to replace advice given to you by your health care provider. Make sure you discuss any questions you have with your health care provider. Document Revised: 09/08/2017 Document Reviewed: 06/22/2017 Elsevier Patient Education  2020 [employer]. ? Wipe from front to back after a bowel movement if you are female. Use each tissue one time when you wipe.  Drink enough fluid to keep your urine pale yellow.  Keep all follow-up visits as told by your health care provider. This is important. Contact a health care provider if:  Your symptoms do not get better after 1-2 days.  Your symptoms go away and then return. Get help right away if you have:  Severe pain in your back or your lower abdomen.  A fever.  Nausea or vomiting. Summary  A urinary tract infection (UTI) is an infection of any part of the urinary tract, which includes the kidneys, ureters, bladder, and urethra.  Most urinary tract infections are caused by bacteria in your genital area, around the entrance to your urinary tract (urethra).  Treatment for this condition often includes antibiotic medicines.  If you were prescribed an antibiotic medicine, take it as told by your health care provider. Do not stop using the antibiotic even if you start to feel better.  Keep all follow-up visits as told by your health care provider. This is important. This information is not intended to replace advice given to you by your health care provider. Make sure you  discuss any questions you have with your health care provider. Document Revised: 09/13/2018 Document Reviewed: 04/05/2018 Elsevier Patient Education  2020 [employer].

## 2020-01-25 LAB — URINALYSIS, MICROSCOPIC ONLY
Bacteria, UA: NONE SEEN
Casts: NONE SEEN /lpf
RBC, Urine: NONE SEEN /hpf (ref 0–2)
WBC, UA: NONE SEEN /hpf (ref 0–5)

## 2020-01-26 LAB — NUSWAB VAGINITIS (VG)
Candida albicans, NAA: NEGATIVE
Candida glabrata, NAA: NEGATIVE
Trich vag by NAA: NEGATIVE

## 2020-01-26 LAB — URINE CULTURE

## 2020-02-08 ENCOUNTER — Other Ambulatory Visit (HOSPITAL_COMMUNITY)
Admission: RE | Admit: 2020-02-08 | Discharge: 2020-02-08 | Disposition: A | Discharge status: Home / Self Care | Payer: BC Managed Care – PPO | Source: Ambulatory Visit | Attending: Pulmonary Disease | Admitting: Pulmonary Disease

## 2020-02-08 DIAGNOSIS — Z20822 Contact with and (suspected) exposure to covid-19: Secondary | ICD-10-CM | POA: Insufficient documentation

## 2020-02-08 DIAGNOSIS — Z01812 Encounter for preprocedural laboratory examination: Secondary | ICD-10-CM | POA: Diagnosis not present

## 2020-02-08 LAB — SARS CORONAVIRUS 2 (TAT 6-24 HRS): SARS Coronavirus 2: NEGATIVE

## 2020-02-12 ENCOUNTER — Ambulatory Visit (INDEPENDENT_AMBULATORY_CARE_PROVIDER_SITE_OTHER): Payer: BC Managed Care – PPO | Admitting: Pulmonary Disease

## 2020-02-12 ENCOUNTER — Ambulatory Visit (INDEPENDENT_AMBULATORY_CARE_PROVIDER_SITE_OTHER): Payer: BC Managed Care – PPO

## 2020-02-12 ENCOUNTER — Ambulatory Visit: Payer: BC Managed Care – PPO | Admitting: Adult Health

## 2020-02-12 ENCOUNTER — Encounter: Payer: Self-pay | Admitting: Adult Health

## 2020-02-12 ENCOUNTER — Other Ambulatory Visit: Payer: Self-pay

## 2020-02-12 VITALS — BP 130/80 | HR 82 | Ht 65.5 in | Wt 161.6 lb

## 2020-02-12 DIAGNOSIS — J309 Allergic rhinitis, unspecified: Secondary | ICD-10-CM | POA: Diagnosis not present

## 2020-02-12 DIAGNOSIS — J45909 Unspecified asthma, uncomplicated: Secondary | ICD-10-CM | POA: Diagnosis not present

## 2020-02-12 DIAGNOSIS — J4550 Severe persistent asthma, uncomplicated: Secondary | ICD-10-CM

## 2020-02-12 LAB — PULMONARY FUNCTION TEST
DL/VA % pred: 115 %
DL/VA: 4.83 ml/min/mmHg/L
DLCO cor % pred: 113 %
DLCO cor: 24.25 ml/min/mmHg
DLCO unc % pred: 120 %
DLCO unc: 25.74 ml/min/mmHg
FEF 25-75 Post: 1.83 L/sec
FEF 25-75 Pre: 1.74 L/sec
FEF2575-%Change-Post: 5 %
FEF2575-%Pred-Post: 72 %
FEF2575-%Pred-Pre: 68 %
FEV1-%Change-Post: 1 %
FEV1-%Pred-Post: 91 %
FEV1-%Pred-Pre: 89 %
FEV1-Post: 2.51 L
FEV1-Pre: 2.47 L
FEV1FVC-%Change-Post: 0 %
FEV1FVC-%Pred-Pre: 93 %
FEV6-%Change-Post: 0 %
FEV6-%Pred-Post: 98 %
FEV6-%Pred-Pre: 98 %
FEV6-Post: 3.39 L
FEV6-Pre: 3.36 L
FEV6FVC-%Change-Post: 0 %
FEV6FVC-%Pred-Post: 103 %
FEV6FVC-%Pred-Pre: 103 %
FVC-%Change-Post: 0 %
FVC-%Pred-Post: 95 %
FVC-%Pred-Pre: 95 %
FVC-Post: 3.39 L
FVC-Pre: 3.37 L
Post FEV1/FVC ratio: 74 %
Post FEV6/FVC ratio: 100 %
Pre FEV1/FVC ratio: 73 %
Pre FEV6/FVC Ratio: 100 %
RV % pred: 104 %
RV: 2.11 L
TLC % pred: 104 %
TLC: 5.51 L

## 2020-02-12 MED ORDER — BUDESONIDE-FORMOTEROL FUMARATE 80-4.5 MCG/ACT IN AERO: 2.0000 | INHALATION_SPRAY | Freq: Two times a day (BID) | RESPIRATORY_TRACT | 5 refills | Status: DC

## 2020-02-12 MED ORDER — AEROCHAMBER MV MISC: 0 refills | Status: AC

## 2020-02-12 NOTE — Progress Notes (Signed)
PFT done today. 

## 2020-02-12 NOTE — Progress Notes (Signed)
@Patient  ID: Carol Acosta, female    DOB: 04-16-1961, 59 y.o.   MRN: [id number]    Referring provider: [name], MD  HPI: 59 year old female never smoker seen for pulmonary consult January 01, 2020 for asthma and allergic rhinitis  TEST/EVENTS :   02/12/2020 Follow up : Asthma Patient returns for a 56-month follow-up.  Patient was seen last visit for a pulmonary consult for asthma.  She was having ongoing symptoms of cough, shortness of breath and wheezing.  She was started on Trelegy inhaler.  She says she does feel that this helped with her breathing however she was unable to tolerate.  It seemed to cause irritation in her mouth possibly thrush.  But it also caused some urinary urgency issues.  She says she was checked for urinary tract infection which was negative but she had ongoing urinary symptoms while taking.  She did stop Trelegy and noticed that her urinary symptoms went away.  Also her irritation in her mouth improved.  Patient had pulmonary function testing today that showed normal lung function with no airflow obstruction or restriction.  DLCO was normal. She says she is trying to be more active.  She has joined a gym this week. She does feel like that her breathing is slightly better since last visit.  She has decreased cough and wheezing.  No Known Allergies  Immunization History  Administered Date(s) Administered  . Influenza Inj Mdck Quad With Preservative 07/10/2017  . Influenza,inj,Quad PF,6+ Mos 07/11/2019  . Influenza-Unspecified 07/10/2016    Past Medical History:  Diagnosis Date  . Allergic rhinitis   . Asthma   . Hyperlipidemia   . Hypertension   . Osteopenia     Tobacco History: Social History   Tobacco Use  Smoking Status Never Smoker  Smokeless Tobacco Never Used   Counseling given: Not Answered   Outpatient Medications Prior to Visit  Medication Sig Dispense Refill  . albuterol (PROAIR HFA) 108 (90 Base) MCG/ACT inhaler Inhale 1 puff  into the lungs as needed.    Marland Kitchen albuterol (PROVENTIL) (2.5 MG/3ML) 0.083% nebulizer solution     . aspirin EC 81 MG tablet Take 1 tablet (81 mg total) by mouth daily. 30 tablet 3  . BREO ELLIPTA 200-25 MCG/INH AEPB Inhale 1 puff into the lungs daily.    . clotrimazole-betamethasone (LOTRISONE) cream Apply 1 application topically as needed.     . conjugated estrogens (PREMARIN) vaginal cream Place 1/2 gram vaginally at hs x2 weeks, and then 1/2 gram vaginally at hs two to three times a week. 30 g 3  . desloratadine (CLARINEX) 5 MG tablet Take 5 mg by mouth as needed.     . diltiazem (CARDIZEM CD) 120 MG 24 hr capsule TAKE (1) CAPSULE DAILY. 30 capsule 1  . ergocalciferol (VITAMIN D2) 1.25 MG (50000 UT) capsule Vitamin D2 1,250 mcg (50,000 unit) capsule    . estrogen, conjugated,-medroxyprogesterone (PREMPRO) 0.45-1.5 MG tablet Take 1 tablet by mouth daily. 90 tablet 2  . montelukast (SINGULAIR) 10 MG tablet Take 1 tablet (10 mg total) by mouth at bedtime. 30 tablet 11  . phenazopyridine (PYRIDIUM) 200 MG tablet Take 1 tablet (200 mg total) by mouth 3 (three) times daily as needed. 6 tablet 0  . rosuvastatin (CRESTOR) 20 MG tablet Take 20 mg by mouth daily.    . sertraline (ZOLOFT) 25 MG tablet Take 1 tablet by mouth as needed.    . sulfamethoxazole-trimethoprim (BACTRIM DS) 800-160 MG tablet Take 1 tablet by  mouth 2 (two) times daily. One PO BID x 3 days 6 tablet 0  . triamterene-hydrochlorothiazide (DYAZIDE) 37.5-25 MG capsule Take 1 capsule by mouth daily.     . Fluticasone-Umeclidin-Vilant (TRELEGY ELLIPTA) 200-62.5-25 MCG/INH AEPB Inhale 1 puff into the lungs daily. (Patient not taking: Reported on 01/24/2020) 14 each 0  . rosuvastatin (CRESTOR) 20 MG tablet Take 1 tablet (20 mg total) by mouth daily. 30 tablet 3   No facility-administered medications prior to visit.     Review of Systems:   Constitutional:   No  weight loss, night sweats,  Fevers, chills,  +fatigue, or   lassitude.  HEENT:   No headaches,  Difficulty swallowing,  Tooth/dental problems, or  Sore throat,                No sneezing, itching, ear ache,  +nasal congestion, post nasal drip,   CV:  No chest pain,  Orthopnea, PND, swelling in lower extremities, anasarca, dizziness, palpitations, syncope.   GI  No heartburn, indigestion, abdominal pain, nausea, vomiting, diarrhea, change in bowel habits, loss of appetite, bloody stools.   Resp: .  No chest wall deformity  Skin: no rash or lesions.  GU: no dysuria, change in color of urine, no urgency or frequency.  No flank pain, no hematuria   MS:  No joint pain or swelling.  No decreased range of motion.  No back pain.    Physical Exam    GEN: A/Ox3; pleasant , NAD, well nourished    HEENT:  [place]/AT,  NOSE-clear, THROAT-clear, no lesions, no postnasal drip or exudate noted.   NECK:  Supple w/ fair ROM; no JVD; normal carotid impulses w/o bruits; no thyromegaly or nodules palpated; no lymphadenopathy.    RESP  Clear  P & A; w/o, wheezes/ rales/ or rhonchi. no accessory muscle use, no dullness to percussion  CARD:  RRR, no m/r/g, no peripheral edema, pulses intact, no cyanosis or clubbing.  GI:   Soft & nt; nml bowel sounds; no organomegaly or masses detected.   Musco: Warm bil, no deformities or joint swelling noted.   Neuro: alert, no focal deficits noted.    Skin: Warm, no lesions or rashes    Lab Results:  CBC  BMET No results found for: NA, K, CL, CO2, GLUCOSE, BUN, CREATININE, CALCIUM, GFRNONAA, GFRAA  BNP No results found for: BNP  ProBNP No results found for: PROBNP  Imaging: No results found.    PFT Results Latest Ref Rng & Units 02/12/2020  FVC-Pre L 3.37  FVC-Predicted Pre % 95  FVC-Post L 3.39  FVC-Predicted Post % 95  Pre FEV1/FVC % % 73  Post FEV1/FCV % % 74  FEV1-Pre L 2.47  FEV1-Predicted Pre % 89  FEV1-Post L 2.51  DLCO UNC% % 120  DLCO COR %Predicted % 115  TLC L 5.51  TLC % Predicted %  104  RV % Predicted % 104    No results found for: NITRICOXIDE      Assessment & Plan:   No problem-specific Assessment & Plan notes found for this encounter.     Rubye Oaks, NP 02/12/2020

## 2020-02-12 NOTE — Patient Instructions (Signed)
Remain off TRELEGY .  Begin Symbicort 80 2 puffs Twice daily  W/ spacer, brush /rinse and gargle after use .  Activity as tolerated.  Continue on Singulair and Clarinex .  Follow up with Dr. Tonia Brooms or Lariya Kinzie NP in 2 months and As needed   Please contact office for sooner follow up if symptoms do not improve or worsen or seek emergency care

## 2020-02-13 DIAGNOSIS — J309 Allergic rhinitis, unspecified: Secondary | ICD-10-CM | POA: Insufficient documentation

## 2020-02-13 NOTE — Assessment & Plan Note (Signed)
Moderate persistent asthma.  Pulmonary function testing today shows normal lung function.  She did have decreased symptom burden on triple therapy.  Unfortunate was unable to tolerate dry powder.  We will begin Symbicort inhaler with spacer.  Would continue on trigger prevention for allergic rhinitis.  Continue on Singulair and Clarinex. Check chest x-ray today  Plan  Patient Instructions  Remain off TRELEGY .  Begin Symbicort 80 2 puffs Twice daily  W/ spacer, brush /rinse and gargle after use .  Activity as tolerated.  Continue on Singulair and Clarinex .  Follow up with Dr. Tonia Brooms or Josanne Boerema NP in 2 months and As needed   Please contact office for sooner follow up if symptoms do not improve or worsen or seek emergency care

## 2020-02-13 NOTE — Assessment & Plan Note (Signed)
Cont on current regimen  

## 2020-02-17 ENCOUNTER — Ambulatory Visit: Payer: BC Managed Care – PPO | Admitting: Cardiology

## 2020-02-22 NOTE — Progress Notes (Deleted)
   Follow up visit  Subjective:   Carol Acosta, female    DOB: [birth year], 59 y.o.   MRN: [id number]    *** HPI  No chief complaint on file.   59 y.o. *** female with ***  ***  *** Current Outpatient Medications on File Prior to Visit  Medication Sig Dispense Refill  . albuterol (PROAIR HFA) 108 (90 Base) MCG/ACT inhaler Inhale 1 puff into the lungs as needed.    Marland Kitchen albuterol (PROVENTIL) (2.5 MG/3ML) 0.083% nebulizer solution     . aspirin EC 81 MG tablet Take 1 tablet (81 mg total) by mouth daily. 30 tablet 3  . budesonide-formoterol (SYMBICORT) 80-4.5 MCG/ACT inhaler Inhale 2 puffs into the lungs in the morning and at bedtime. 1 Inhaler 5  . clotrimazole-betamethasone (LOTRISONE) cream Apply 1 application topically as needed.     . conjugated estrogens (PREMARIN) vaginal cream Place 1/2 gram vaginally at hs x2 weeks, and then 1/2 gram vaginally at hs two to three times a week. 30 g 3  . desloratadine (CLARINEX) 5 MG tablet Take 5 mg by mouth daily.     Marland Kitchen diltiazem (CARDIZEM CD) 120 MG 24 hr capsule TAKE (1) CAPSULE DAILY. 30 capsule 1  . ergocalciferol (VITAMIN D2) 1.25 MG (50000 UT) capsule Vitamin D2 1,250 mcg (50,000 unit) capsule    . montelukast (SINGULAIR) 10 MG tablet Take 1 tablet (10 mg total) by mouth at bedtime. 30 tablet 11  . rosuvastatin (CRESTOR) 20 MG tablet Take 20 mg by mouth daily.    . sertraline (ZOLOFT) 25 MG tablet Take 1 tablet by mouth as needed.    Marland Kitchen Spacer/Aero-Holding Chambers (AEROCHAMBER MV) inhaler Use as instructed 1 each 0  . triamterene-hydrochlorothiazide (DYAZIDE) 37.5-25 MG capsule Take 1 capsule by mouth daily.      No current facility-administered medications on file prior to visit.    Cardiovascular & other pertient studies:  *** EKG ***/***/202***: ***  Recent labs: ***/***/202***: Glucose ***, BUN/Cr ***/***. EGFR ***. Na/K ***/***. ***Rest of the CMP normal H/H ***/***. MCV ***. Platelets *** ***HbA1C ***% Chol ***, TG  ***, HDL ***, LDL *** ***TSH ***normal   *** ROS      *** There were no vitals filed for this visit.  There is no height or weight on file to calculate BMI. There were no vitals filed for this visit.  *** Objective:   Physical Exam        Assessment & Recommendations:   ***  ***    Manish Esther Hardy, MD [facility] Pager: [phone] Office: [phone]

## 2020-02-24 ENCOUNTER — Ambulatory Visit: Payer: BC Managed Care – PPO | Admitting: Cardiology

## 2020-02-24 ENCOUNTER — Encounter: Payer: Self-pay | Admitting: Cardiology

## 2020-02-24 ENCOUNTER — Other Ambulatory Visit: Payer: Self-pay

## 2020-02-24 VITALS — BP 138/74 | HR 72 | Temp 98.3°F | Ht 65.0 in | Wt 162.0 lb

## 2020-02-24 DIAGNOSIS — E782 Mixed hyperlipidemia: Secondary | ICD-10-CM

## 2020-02-24 DIAGNOSIS — I10 Essential (primary) hypertension: Secondary | ICD-10-CM

## 2020-02-24 NOTE — Progress Notes (Signed)
Patient referred by [name], MD for palpitations, numbness.  Subjective:   Carol Acosta, female    DOB: 23-May-1961, 59 y.o.   MRN: [id number]   Chief Complaint  Patient presents with  . Palpitations  . Follow-up    HPI   59 y.o. Caucasian female with hypertension, hyperlipidemia, asthma, anxiety, palpitations, tingling, numbness.  All symptoms are resolved after decreasing stress after retirement.  Blood pressure has been in 130s/80s.  She is now looking forward to starting regular physical activity, including walking.  Lipid panel remarkably improved, details below.    Current Outpatient Medications on File Prior to Visit  Medication Sig Dispense Refill  . albuterol (PROAIR HFA) 108 (90 Base) MCG/ACT inhaler Inhale 1 puff into the lungs as needed.    Marland Kitchen albuterol (PROVENTIL) (2.5 MG/3ML) 0.083% nebulizer solution     . aspirin EC 81 MG tablet Take 1 tablet (81 mg total) by mouth daily. 30 tablet 3  . budesonide-formoterol (SYMBICORT) 80-4.5 MCG/ACT inhaler Inhale 2 puffs into the lungs in the morning and at bedtime. 1 Inhaler 5  . clotrimazole-betamethasone (LOTRISONE) cream Apply 1 application topically as needed.     . conjugated estrogens (PREMARIN) vaginal cream Place 1/2 gram vaginally at hs x2 weeks, and then 1/2 gram vaginally at hs two to three times a week. 30 g 3  . desloratadine (CLARINEX) 5 MG tablet Take 5 mg by mouth daily.     Marland Kitchen diltiazem (CARDIZEM CD) 120 MG 24 hr capsule TAKE (1) CAPSULE DAILY. 30 capsule 1  . ergocalciferol (VITAMIN D2) 1.25 MG (50000 UT) capsule Vitamin D2 1,250 mcg (50,000 unit) capsule    . montelukast (SINGULAIR) 10 MG tablet Take 1 tablet (10 mg total) by mouth at bedtime. 30 tablet 11  . rosuvastatin (CRESTOR) 20 MG tablet Take 20 mg by mouth daily.    . sertraline (ZOLOFT) 25 MG tablet Take 1 tablet by mouth as needed.    Marland Kitchen Spacer/Aero-Holding Chambers (AEROCHAMBER MV) inhaler Use as instructed 1 each 0  .  triamterene-hydrochlorothiazide (DYAZIDE) 37.5-25 MG capsule Take 1 capsule by mouth daily.      No current facility-administered medications on file prior to visit.    Cardiovascular studies:  EKG 02/24/2020: Sinus rhythm 78 bpm. Normal EKG.  Echocardiogram 08/05/2019: Left ventricle cavity is normal in size. Mild concentric hypertrophy of the left ventricle. Hyperdynamic global wall motion. Normal LV systolic function with visual EF 65-70%. Doppler evidence of grade I (impaired) diastolic dysfunction, normal LAP.  No significant valvular abnormality.  Normal right atrial pressure.   CTA head/neck 08/05/2019: Normal CTA of the head and neck.  Recent labs: 02/11/2020: Chol 158, TG 117, HDL 64, LDL 71 Glucose 90. BUN/Cr 9/0.7. eGFR 91. Na/K 142/3.7   05/15/2019: H/H 16/48.  MCV 85.  Platelets 295. Cholesterol 261, triglycerides 139, HDL 54, LDL 179  Review of Systems  Cardiovascular: Negative for chest pain, dyspnea on exertion, leg swelling, palpitations and syncope.         Vitals:   02/24/20 1134  BP: 140/78  Pulse: 75  Temp: 98.3 F (36.8 C)  SpO2: 98%     Body mass index is 26.96 kg/m. Filed Weights   02/24/20 1134  Weight: 162 lb (73.5 kg)     Objective:   Physical Exam  Constitutional: No distress.  Neck: No JVD present.  Cardiovascular: Normal rate, regular rhythm, normal heart sounds and intact distal pulses.  No murmur heard. Pulmonary/Chest: Effort normal and breath sounds  normal. She has no wheezes. She has no rales.  Musculoskeletal:        General: No edema.  Nursing note and vitals reviewed.         Assessment & Recommendations:   59 y.o. Caucasian female with hypertension, hyperlipidemia, asthma, anxiety, palpitations, tingling, numbness.  Hyperlipidemia:  LDL down to 71 on Crestor 20 mg daily.  Continue the same.  Hypertension: Reasonably well controlled.  In future, if her SBP is greater than 140 mmHg, consider increasing  diltiazem to 240 mg daily.   Primary prevention: In absence of known ASCVD, reasonable to stop aspirin.  I will see her on as-needed basis.  Continue follow-up with PCP regarding hypertension management.  Nigel Mormon, MD [facility] Pager: [phone] Office: [phone] If no answer Cell [phone]

## 2020-03-20 ENCOUNTER — Other Ambulatory Visit: Payer: Self-pay | Admitting: Cardiology

## 2020-03-20 DIAGNOSIS — R002 Palpitations: Secondary | ICD-10-CM

## 2020-03-20 DIAGNOSIS — I10 Essential (primary) hypertension: Secondary | ICD-10-CM

## 2020-03-25 ENCOUNTER — Telehealth: Payer: Self-pay | Admitting: Pulmonary Disease

## 2020-03-25 MED ORDER — AZITHROMYCIN 250 MG PO TABS: ORAL_TABLET | ORAL | 0 refills | Status: DC

## 2020-03-25 MED ORDER — FLUTICASONE PROPIONATE 50 MCG/ACT NA SUSP: 2.0000 | Freq: Every day | NASAL | 2 refills | Status: DC

## 2020-03-25 NOTE — Telephone Encounter (Signed)
Spoke with patient. She stated that she is not having any neurological symptoms, just a standard sinus infection. She wants to try the Flonase and zpak. Will go ahead and send in the RX for her.   Nothing further needed at time of call.

## 2020-03-25 NOTE — Telephone Encounter (Signed)
Please make sure she has no neurological symptoms with her headaches such as speech or vision changes or arm weakness, vomiting.  Please document this in note.  If these are present she will need to be evaluated by her primary care doctor immediately or go to the emergency room  If is negative for the above neurological symptoms can :  Recommend saline nasal rinses twice daily, can try Flonase 2 puffs daily May try Mucinex twice daily as needed for congestion Continue on Clarinex daily If not improving can have a Z-Pak #1 to have on hold if symptoms are not improving for a sinus infection.  Take as directed.  Take with food.  If likes yogurt can eat yogurt daily while on antibiotic.  If symptoms are improving will need office visit for evaluation.  Please contact office for sooner follow up if symptoms do not improve or worsen or seek emergency care

## 2020-03-25 NOTE — Telephone Encounter (Signed)
Spoke with patient. She stated that she has had an increase in facial pressure since 03/17/20. The pressure is located around her eyes and nose. She has had a sinus headache everyday. She has been taking Advil around the clock to get rid of the headache but it is not working. For the first 2 days she did have a runny nose with clear mucus but since she started back to taking the Clarinex, her nose is now dry.   She denied any fevers or body aches. Also denied being around who has recently been sick.   Her pharmacy is [facility].   TP, can you please advise since you were the last one to see her? Thanks!

## 2020-03-27 ENCOUNTER — Telehealth: Payer: Self-pay | Admitting: Pulmonary Disease

## 2020-03-27 NOTE — Telephone Encounter (Signed)
Pt no longer needs a call back. She stated that she is going to see her allergy specialist today.

## 2020-03-27 NOTE — Telephone Encounter (Signed)
Noted.  Will close encounter.  

## 2020-03-30 ENCOUNTER — Other Ambulatory Visit: Payer: Self-pay | Admitting: Obstetrics and Gynecology

## 2020-03-30 DIAGNOSIS — Z1231 Encounter for screening mammogram for malignant neoplasm of breast: Secondary | ICD-10-CM

## 2020-04-14 ENCOUNTER — Ambulatory Visit: Payer: BC Managed Care – PPO | Admitting: Adult Health

## 2020-04-24 ENCOUNTER — Telehealth: Payer: Self-pay | Admitting: Obstetrics & Gynecology

## 2020-04-24 NOTE — Telephone Encounter (Signed)
Spoke with patient. Patient reports external vaginal itching, burning and raw feeling. Symptoms started on 7/12. She has tried OTC monistat 1 day and started a 7day, AZO yeast plus and vagisil, no change in symptoms. Denies vaginal odor, d/c, bleeding or urinary symptoms. Offered OV for 05/05/20 at 11am with Dr. Edward Jolly, patient declined. Recommended stopping OTC medications and trying coconut oil to vulvar area. Advised if symptoms do not improve or resolve, return call to office for OV. Will provide update to Dr. Edward Jolly and return call if any additional recommendations. Patient agreeable.   Routing to provider for final review. Patient is agreeable to disposition. Will close encounter.

## 2020-04-24 NOTE — Telephone Encounter (Signed)
Patient has a yeast infection. °

## 2020-04-27 ENCOUNTER — Ambulatory Visit: Payer: BC Managed Care – PPO | Admitting: Adult Health

## 2020-04-27 ENCOUNTER — Other Ambulatory Visit: Payer: Self-pay

## 2020-04-27 ENCOUNTER — Encounter: Payer: Self-pay | Admitting: Adult Health

## 2020-04-27 DIAGNOSIS — J45909 Unspecified asthma, uncomplicated: Secondary | ICD-10-CM

## 2020-04-27 DIAGNOSIS — J309 Allergic rhinitis, unspecified: Secondary | ICD-10-CM | POA: Diagnosis not present

## 2020-04-27 NOTE — Progress Notes (Signed)
@Patient  ID: , female    DOB: [birth year], 59 y.o.   MRN: [id number]  Chief Complaint  Patient presents with  . Follow-up    Asthma     Referring provider: [id number], MD  HPI: 59 yo female never smoker seen for pulmonary consult January 01, 2020 for Asthma and Allergic Rhinitis   TEST/EVENTS :  PFTs 02/2020 -normal lung function and DLCO  CXR 02/12/2020  RAST + ragweed,  Grass, trees , dogs++/ cat  , dust  IgE 91  Has 2 dogs   04/27/2020 Follow up: Asthma and Allergies  Patient returns for a 55-month follow-up for asthma and allergic rhinitis.  Patient was seen earlier this year for a pulmonary consult for asthma and allergic rhinitis.  Allergy profile showed significant reactions to ragweed grass trees dogs and cats.  IgE was at 91.  Pulmonary function testing showed normal lung function with no airflow obstruction or restriction.  DLCO was normal.  Patient was felt to have an asthmatic component.  She had previously been started on Trelegy but felt that she had to me side effects.  She was changed to Symbicort.  However patient did not feel like this was very effective.  She was seen by her allergist and change back to [facility].  She also had been placed on Singulair and Clarinex.  Patient says that Clarinex definitely made her symptoms worse. She is recently been started back on allergy shots.  Clarinex was stopped.  She says since starting back on Breo and Clarinex she is feeling much better.  She says she did have an acute sinusitis recently took a Z-Pak which was not effective.  She says in the past Z-Pak has not worked for her at all.  She was started on Augmentin and completed a full course by her allergist.  And says that she is much better.  She has been referred to ENT for chronic sinus disease.  She is also been referred to GI for possible GERD component. Over the last couple weeks she says she has been doing well.  No increased albuterol use.  No Known  Allergies  Immunization History  Administered Date(s) Administered  . Influenza Inj Mdck Quad With Preservative 07/10/2017  . Influenza,inj,Quad PF,6+ Mos 07/11/2019  . Influenza-Unspecified 07/10/2016  . PFIZER SARS-COV-2 Vaccination 12/24/2019, 01/15/2020    Past Medical History:  Diagnosis Date  . Allergic rhinitis   . Asthma   . Hyperlipidemia   . Hypertension   . Osteopenia     Tobacco History: Social History   Tobacco Use  Smoking Status Never Smoker  Smokeless Tobacco Never Used   Counseling given: Not Answered   Outpatient Medications Prior to Visit  Medication Sig Dispense Refill  . albuterol (PROAIR HFA) 108 (90 Base) MCG/ACT inhaler Inhale 1 puff into the lungs as needed.    03/16/2020 albuterol (PROVENTIL) (2.5 MG/3ML) 0.083% nebulizer solution     . clotrimazole-betamethasone (LOTRISONE) cream Apply 1 application topically as needed.     . conjugated estrogens (PREMARIN) vaginal cream Place 1/2 gram vaginally at hs x2 weeks, and then 1/2 gram vaginally at hs two to three times a week. 30 g 3  . desloratadine (CLARINEX) 5 MG tablet Take 5 mg by mouth daily.     Marland Kitchen diltiazem (CARDIZEM CD) 120 MG 24 hr capsule TAKE (1) CAPSULE DAILY. 90 capsule 3  . ergocalciferol (VITAMIN D2) 1.25 MG (50000 UT) capsule Vitamin D2 1,250 mcg (50,000 unit) capsule    .  fluticasone (FLONASE) 50 MCG/ACT nasal spray Place 2 sprays into both nostrils daily. 16 g 2  . fluticasone furoate-vilanterol (BREO ELLIPTA) 100-25 MCG/INH AEPB Inhale 1 puff into the lungs daily.    . montelukast (SINGULAIR) 10 MG tablet Take 1 tablet (10 mg total) by mouth at bedtime. 30 tablet 11  . rosuvastatin (CRESTOR) 20 MG tablet TAKE 1 TABLET ONCE DAILY. 90 tablet 3  . sertraline (ZOLOFT) 25 MG tablet Take 1 tablet by mouth as needed.    Marland Kitchen Spacer/Aero-Holding Chambers (AEROCHAMBER MV) inhaler Use as instructed 1 each 0  . triamterene-hydrochlorothiazide (DYAZIDE) 37.5-25 MG capsule Take 1 capsule by mouth daily.      . budesonide-formoterol (SYMBICORT) 80-4.5 MCG/ACT inhaler Inhale 2 puffs into the lungs in the morning and at bedtime. (Patient not taking: Reported on 04/27/2020) 1 Inhaler 5  . azithromycin (ZITHROMAX) 250 MG tablet Take 2 tablets on first day, then 1 tablet daily until finished. 6 tablet 0   No facility-administered medications prior to visit.     Review of Systems:   Constitutional:   No  weight loss, night sweats,  Fevers, chills, fatigue, or  lassitude.  HEENT:   No headaches,  Difficulty swallowing,  Tooth/dental problems, or  Sore throat,                No sneezing, itching, ear ache,  +nasal congestion, post nasal drip,   CV:  No chest pain,  Orthopnea, PND, swelling in lower extremities, anasarca, dizziness, palpitations, syncope.   GI  No heartburn, indigestion, abdominal pain, nausea, vomiting, diarrhea, change in bowel habits, loss of appetite, bloody stools.   Resp:  .  No chest wall deformity  Skin: no rash or lesions.  GU: no dysuria, change in color of urine, no urgency or frequency.  No flank pain, no hematuria   MS:  No joint pain or swelling.  No decreased range of motion.  No back pain.    Physical Exam  BP 140/64 (BP Location: Left Arm, Cuff Size: Normal)   Pulse 68   Temp 98.1 F (36.7 C) (Oral)   Ht 5' 5.5" (1.664 m)   Wt 166 lb 9.6 oz (75.6 kg)   SpO2 97%   BMI 27.30 kg/m   GEN: A/Ox3; pleasant , NAD, well nourished    HEENT:  [place]/AT,    NOSE-clear, THROAT-clear, no lesions, no postnasal drip or exudate noted.   NECK:  Supple w/ fair ROM; no JVD; normal carotid impulses w/o bruits; no thyromegaly or nodules palpated; no lymphadenopathy.    RESP  Clear  P & A; w/o, wheezes/ rales/ or rhonchi. no accessory muscle use, no dullness to percussion  CARD:  RRR, no m/r/g, no peripheral edema, pulses intact, no cyanosis or clubbing.  GI:   Soft & nt; nml bowel sounds; no organomegaly or masses detected.   Musco: Warm bil, no deformities or joint  swelling noted.   Neuro: alert, no focal deficits noted.    Skin: Warm, no lesions or rashes    Lab Results:  CBC  BMET No results found for: NA, K, CL, CO2, GLUCOSE, BUN, CREATININE, CALCIUM, GFRNONAA, GFRAA  BNP No results found for: BNP  ProBNP No results found for: PROBNP  Imaging: No results found.    PFT Results Latest Ref Rng & Units 02/12/2020  FVC-Pre L 3.37  FVC-Predicted Pre % 95  FVC-Post L 3.39  FVC-Predicted Post % 95  Pre FEV1/FVC % % 73  Post FEV1/FCV % % 74  FEV1-Pre L 2.47  FEV1-Predicted Pre % 89  FEV1-Post L 2.51  DLCO UNC% % 120  DLCO COR %Predicted % 115  TLC L 5.51  TLC % Predicted % 104  RV % Predicted % 104    No results found for: NITRICOXIDE      Assessment & Plan:   Asthma, intrinsic Currently doing well on combination of Breo and maintenance regimen with Singulair, saline nasal rinses. Agree with referral to ENT for possible underlying chronic sinus disease.  And possible GERD with a GI referral. Discussed trigger prevention and allergy proofing her home.  Plan  Patient Instructions  Continue on BREO 1 puff daily , rinse after use.  Albuterol Inhaler As needed   Continue on Singulair daily.  Follow with ENT and GI as planned Follow up with Allergist as planned.  Follow up with Dr. Tonia Brooms in 3 months and As needed   Please contact office for sooner follow up if symptoms do not improve or worsen or seek emergency care       Allergic rhinitis Continue on current regimen.  ENT follow-up as planned     Rubye Oaks, NP 04/27/2020

## 2020-04-27 NOTE — Assessment & Plan Note (Signed)
Currently doing well on combination of Breo and maintenance regimen with Singulair, saline nasal rinses. Agree with referral to ENT for possible underlying chronic sinus disease.  And possible GERD with a GI referral. Discussed trigger prevention and allergy proofing her home.  Plan  Patient Instructions  Continue on BREO 1 puff daily , rinse after use.  Albuterol Inhaler As needed   Continue on Singulair daily.  Follow with ENT and GI as planned Follow up with Allergist as planned.  Follow up with Dr. Tonia Brooms in 3 months and As needed   Please contact office for sooner follow up if symptoms do not improve or worsen or seek emergency care

## 2020-04-27 NOTE — Patient Instructions (Addendum)
Continue on BREO 1 puff daily , rinse after use.  Albuterol Inhaler As needed   Continue on Singulair daily.  Follow with ENT and GI as planned Follow up with Allergist as planned.  Follow up with Dr. Tonia Brooms in 3 months and As needed   Please contact office for sooner follow up if symptoms do not improve or worsen or seek emergency care

## 2020-04-27 NOTE — Assessment & Plan Note (Signed)
Continue on current regimen.  ENT follow-up as planned

## 2020-04-29 ENCOUNTER — Other Ambulatory Visit: Payer: Self-pay | Admitting: Otolaryngology

## 2020-04-29 DIAGNOSIS — J329 Chronic sinusitis, unspecified: Secondary | ICD-10-CM

## 2020-05-13 ENCOUNTER — Ambulatory Visit
Admission: RE | Admit: 2020-05-13 | Discharge: 2020-05-13 | Disposition: A | Discharge status: Home / Self Care | Payer: BC Managed Care – PPO | Source: Ambulatory Visit | Attending: Obstetrics and Gynecology | Admitting: Obstetrics and Gynecology

## 2020-05-13 ENCOUNTER — Other Ambulatory Visit: Payer: Self-pay

## 2020-05-13 DIAGNOSIS — Z1231 Encounter for screening mammogram for malignant neoplasm of breast: Secondary | ICD-10-CM

## 2020-05-14 ENCOUNTER — Other Ambulatory Visit: Payer: BC Managed Care – PPO

## 2020-06-02 ENCOUNTER — Ambulatory Visit
Admission: RE | Admit: 2020-06-02 | Discharge: 2020-06-02 | Disposition: A | Discharge status: Home / Self Care | Payer: BC Managed Care – PPO | Source: Ambulatory Visit | Attending: Otolaryngology | Admitting: Otolaryngology

## 2020-06-02 DIAGNOSIS — J329 Chronic sinusitis, unspecified: Secondary | ICD-10-CM

## 2020-06-10 DIAGNOSIS — K449 Diaphragmatic hernia without obstruction or gangrene: Secondary | ICD-10-CM

## 2020-06-10 HISTORY — DX: Diaphragmatic hernia without obstruction or gangrene: K44.9

## 2020-06-24 NOTE — Progress Notes (Signed)
59 y.o. G2P2 Married Caucasian female here for annual exam.    Patient would like to lose weight. She is considering Ozempic through her PCP.   Joined a gym but does not have her rhythm yet for this.   Patient is now on PremPro 0.45/1.5 and takes it every other day.  She notices hot flashes when she skips a dose.  It was stopped when she had facial numbness, which was attributed to stress.  She saw cardiology and has increased her Crestor.  She had a normal CT of the brain.   She is also using coconut oil for vaginal dryness.  Not using the Premarin cream.  Patient is having allergy injections.   Saw Dr. Ewing Schlein and she has bloating and a hiatal hernia.  She will see a chiropractor next to try to improve her hiatal hernia.  Retired now.   PCP: [name], MD  No LMP recorded. Patient has had an ablation.            Sexually active: Yes.    The current method of family planning is none--Ablation.    Exercising: No.  The patient does not participate in regular exercise at present. Smoker:  no  Health Maintenance: Pap:  10-29-14 Neg:Neg HR HPV--pt. Thought had pap 2017 History of abnormal Pap:  Yes, years ago. She may have had cryotherapy to cervix MMG: 05-13-20 3D/Neg/density C/BiRads1 Colonoscopy: 6 years ago--normal BMD: 2018  Result :Osteopenia--PCP TDaP:  PCP Gardasil:   no HIV:no Hep C:no Screening Labs:  PCP and cardiology.    reports that she has never smoked. She has never used smokeless tobacco. She reports previous alcohol use. She reports that she does not use drugs.  Past Medical History:  Diagnosis Date  . Allergic rhinitis   . Asthma   . Hiatal hernia 06/2020  . History of COVID-19 06/2019  . Hyperlipidemia   . Hypertension   . Osteopenia     Past Surgical History:  Procedure Laterality Date  . CESAREAN SECTION  2000  . ENDOMETRIAL ABLATION    . NASAL SINUS SURGERY  1989    Current Outpatient Medications  Medication Sig Dispense Refill  .  albuterol (PROAIR HFA) 108 (90 Base) MCG/ACT inhaler Inhale 1 puff into the lungs as needed.    Marland Kitchen albuterol (PROVENTIL) (2.5 MG/3ML) 0.083% nebulizer solution     . BREO ELLIPTA 200-25 MCG/INH AEPB Inhale 1 puff into the lungs daily.    . clotrimazole-betamethasone (LOTRISONE) cream Apply 1 application topically as needed.     Marland Kitchen EPINEPHrine 0.3 mg/0.3 mL IJ SOAJ injection Inject into the muscle as directed.    . fluticasone (FLONASE) 50 MCG/ACT nasal spray Place 2 sprays into both nostrils daily. 16 g 2  . montelukast (SINGULAIR) 10 MG tablet Take 1 tablet (10 mg total) by mouth at bedtime. 30 tablet 11  . olmesartan-hydrochlorothiazide (BENICAR HCT) 40-12.5 MG tablet Take 1 tablet by mouth daily.    Marland Kitchen omeprazole (PRILOSEC) 20 MG capsule Take 20 mg by mouth 2 (two) times daily.    Marland Kitchen OVER THE COUNTER MEDICATION Vear Clock colon health Takes 1 daily    . PREMPRO 0.45-1.5 MG tablet Take 1 tablet by mouth daily.    . rosuvastatin (CRESTOR) 20 MG tablet TAKE 1 TABLET ONCE DAILY. 90 tablet 3  . Spacer/Aero-Holding Chambers (AEROCHAMBER MV) inhaler Use as instructed 1 each 0  . conjugated estrogens (PREMARIN) vaginal cream Place 1/2 gram vaginally at hs x2 weeks, and then 1/2 gram vaginally  at hs two to three times a week. (Patient not taking: Reported on 06/25/2020) 30 g 3  . diltiazem (CARDIZEM CD) 120 MG 24 hr capsule TAKE (1) CAPSULE DAILY. (Patient not taking: Reported on 06/25/2020) 90 capsule 3   No current facility-administered medications for this visit.    Family History  Problem Relation Age of Onset  . COPD Maternal Grandmother   . Hypertension Maternal Grandmother   . Allergic rhinitis Brother   . Dementia Brother   . Allergic rhinitis Brother   . Asthma Mother   . Allergies Mother   . Hypertension Mother   . Thyroid disease Mother     Review of Systems  All other systems reviewed and are negative.   Exam:   BP (!) 142/76   Pulse 88   Resp 16   Ht 5\' 5"  (1.651 m)   Wt 166  lb (75.3 kg)   BMI 27.62 kg/m     General appearance: alert, cooperative and appears stated age Head: normocephalic, without obvious abnormality, atraumatic Neck: no adenopathy, supple, symmetrical, trachea midline and thyroid normal to inspection and palpation Lungs: clear to auscultation bilaterally Breasts: normal appearance, no masses or tenderness, No nipple retraction or dimpling, No nipple discharge or bleeding, No axillary adenopathy Heart: regular rate and rhythm Abdomen: soft, non-tender; no masses, no organomegaly Extremities: extremities normal, atraumatic, no cyanosis or edema Skin: skin color, texture, turgor normal. No rashes or lesions Lymph nodes: cervical, supraclavicular, and axillary nodes normal. Neurologic: grossly normal  Pelvic: External genitalia:  no lesions              No abnormal inguinal nodes palpated.              Urethra:  normal appearing urethra with no masses, tenderness or lesions              Bartholins and Skenes: normal                 Vagina: normal appearing vagina with normal color and discharge, no lesions              Cervix: no lesions              Pap taken: Yes.   Bimanual Exam:  Uterus:  normal size, contour, position, consistency, mobility, non-tender              Adnexa: no mass, fullness, tenderness              Rectal exam: Yes.  .  Confirms.              Anus:  normal sphincter tone, no lesions  Chaperone was present for exam.  Assessment:   Well woman visit with normal exam. Status post endometrial ablation.  HRT.  Osteopenia.  PCP following.  HTN and elevated cholesterol.  Plan: Mammogram screening discussed. Self breast awareness reviewed. Pap and HR HPV as above. Guidelines for Calcium, Vitamin D, regular exercise program including cardiovascular and weight bearing exercise. Ok to continue Prempro and will lower dosage to 0.3/1.5 She will continue her vaginal estrogen and does not need a refill.  Discused WHI and use  of HRT which can increase risk of PE, DVT, MI, stroke and breast cancer.  Will get copy of BMD from PCP office.  Follow up annually and prn.   After visit summary provided.

## 2020-06-25 ENCOUNTER — Other Ambulatory Visit: Payer: Self-pay

## 2020-06-25 ENCOUNTER — Other Ambulatory Visit (HOSPITAL_COMMUNITY)
Admission: RE | Admit: 2020-06-25 | Discharge: 2020-06-25 | Disposition: A | Discharge status: Home / Self Care | Payer: BC Managed Care – PPO | Source: Ambulatory Visit | Attending: Obstetrics and Gynecology | Admitting: Obstetrics and Gynecology

## 2020-06-25 ENCOUNTER — Ambulatory Visit: Payer: BC Managed Care – PPO | Admitting: Obstetrics and Gynecology

## 2020-06-25 ENCOUNTER — Encounter: Payer: Self-pay | Admitting: Obstetrics and Gynecology

## 2020-06-25 VITALS — BP 142/76 | HR 88 | Resp 16 | Ht 65.0 in | Wt 166.0 lb

## 2020-06-25 DIAGNOSIS — Z01419 Encounter for gynecological examination (general) (routine) without abnormal findings: Secondary | ICD-10-CM | POA: Insufficient documentation

## 2020-06-25 MED ORDER — PREMPRO 0.3-1.5 MG PO TABS: 1.0000 | ORAL_TABLET | Freq: Every day | ORAL | 3 refills | Status: DC

## 2020-06-25 NOTE — Patient Instructions (Signed)

## 2020-06-29 LAB — CYTOLOGY - PAP
Comment: NEGATIVE
Diagnosis: NEGATIVE
Diagnosis: REACTIVE
High risk HPV: NEGATIVE

## 2020-07-09 ENCOUNTER — Other Ambulatory Visit: Payer: Self-pay

## 2020-07-09 ENCOUNTER — Ambulatory Visit (INDEPENDENT_AMBULATORY_CARE_PROVIDER_SITE_OTHER): Payer: BC Managed Care – PPO | Admitting: Adult Health

## 2020-07-09 ENCOUNTER — Encounter: Payer: Self-pay | Admitting: Adult Health

## 2020-07-09 DIAGNOSIS — J309 Allergic rhinitis, unspecified: Secondary | ICD-10-CM | POA: Diagnosis not present

## 2020-07-09 DIAGNOSIS — J45909 Unspecified asthma, uncomplicated: Secondary | ICD-10-CM | POA: Diagnosis not present

## 2020-07-09 MED ORDER — PREDNISONE 10 MG PO TABS: ORAL_TABLET | ORAL | 0 refills | Status: DC

## 2020-07-09 NOTE — Progress Notes (Signed)
@Patient  ID: , female    DOB: 17-Dec-1960, 59 y.o.   MRN: [id number]  Chief Complaint  Patient presents with  . Follow-up    Referring provider: [id number], MD  HPI: 59 year old female never smoker seen for pulmonary consult January 01, 2020 for asthma and allergic rhinitis Patient is followed by allergy and is on allergy shots. Followed by ENT for chronic sinus disease.  TEST/EVENTS :  PFTs 02/2020 -normal lung function and DLCO  CXR 02/12/2020 negative for acute process RAST + ragweed,  Grass, trees , dogs++/ cat  , dust  IgE 91  Has 2 dogs   Sinus CT June 02, 2020 - NEG  07/09/2020 Follow up : Asthma and Allergies  Patient presents for a 73-month follow-up. Patient says over the last week that she has had increased symptoms with productive cough and thick green mucus. Scratchy throat and hoarseness. No significant wheezing. Was doing better until last week.  Symptoms started after working in the yard.  She says she feels ongoing tightness.  Has tried to use her albuterol nebulizer.  Covid vaccines are up-to-date. Patient denies any fever loss of taste or smell.  She remains on Breo daily. She is followed by the allergist and is on allergy shots. She is on Singulair daily. She has been seen by GI and is on Prilosec.  No Known Allergies  Immunization History  Administered Date(s) Administered  . Influenza Inj Mdck Quad With Preservative 07/10/2017  . Influenza,inj,Quad PF,6+ Mos 07/11/2019  . Influenza-Unspecified 07/10/2016  . PFIZER SARS-COV-2 Vaccination 12/24/2019, 01/15/2020    Past Medical History:  Diagnosis Date  . Allergic rhinitis   . Asthma   . Hiatal hernia 06/2020  . History of COVID-19 06/2019  . Hyperlipidemia   . Hypertension   . Osteopenia     Tobacco History: Social History   Tobacco Use  Smoking Status Never Smoker  Smokeless Tobacco Never Used   Counseling given: Not Answered   Outpatient Medications Prior to Visit    Medication Sig Dispense Refill  . albuterol (PROAIR HFA) 108 (90 Base) MCG/ACT inhaler Inhale 1 puff into the lungs as needed.    07/2019 albuterol (PROVENTIL) (2.5 MG/3ML) 0.083% nebulizer solution     . BREO ELLIPTA 200-25 MCG/INH AEPB Inhale 1 puff into the lungs daily.    . clotrimazole-betamethasone (LOTRISONE) cream Apply 1 application topically as needed.     . diltiazem (CARDIZEM CD) 120 MG 24 hr capsule TAKE (1) CAPSULE DAILY. (Patient taking differently: Take 120 mg by mouth as needed. Take as needed when blood pressure is elevated) 90 capsule 3  . EPINEPHrine 0.3 mg/0.3 mL IJ SOAJ injection Inject into the muscle as directed.    . estrogen, conjugated,-medroxyprogesterone (PREMPRO) 0.3-1.5 MG tablet Take 1 tablet by mouth daily. 84 tablet 3  . fluticasone (FLONASE) 50 MCG/ACT nasal spray Place 2 sprays into both nostrils daily. 16 g 2  . montelukast (SINGULAIR) 10 MG tablet Take 1 tablet (10 mg total) by mouth at bedtime. 30 tablet 11  . olmesartan-hydrochlorothiazide (BENICAR HCT) 40-12.5 MG tablet Take 1 tablet by mouth daily.    Marland Kitchen omeprazole (PRILOSEC) 20 MG capsule Take 20 mg by mouth 2 (two) times daily.    Marland Kitchen OVER THE COUNTER MEDICATION Marland Kitchen colon health Takes 1 daily    . rosuvastatin (CRESTOR) 20 MG tablet TAKE 1 TABLET ONCE DAILY. 90 tablet 3  . Spacer/Aero-Holding Chambers (AEROCHAMBER MV) inhaler Use as instructed 1 each 0  . conjugated  estrogens (PREMARIN) vaginal cream Place 1/2 gram vaginally at hs x2 weeks, and then 1/2 gram vaginally at hs two to three times a week. 30 g 3   No facility-administered medications prior to visit.     Review of Systems:   Constitutional:   No  weight loss, night sweats,  Fevers, chills, fatigue, or  lassitude.  HEENT:   No headaches,  Difficulty swallowing,  Tooth/dental problems, or  Sore throat,                No sneezing, itching, ear ache, nasal congestion, post nasal drip,   CV:  No chest pain,  Orthopnea, PND, swelling in  lower extremities, anasarca, dizziness, palpitations, syncope.   GI  No heartburn, indigestion, abdominal pain, nausea, vomiting, diarrhea, change in bowel habits, loss of appetite, bloody stools.   Resp:  .  No chest wall deformity  Skin: no rash or lesions.  GU: no dysuria, change in color of urine, no urgency or frequency.  No flank pain, no hematuria   MS:  No joint pain or swelling.  No decreased range of motion.  No back pain.    Physical Exam  BP 132/70 (BP Location: Left Arm, Cuff Size: Normal)   Pulse 88   Temp (!) 96.7 F (35.9 C) (Temporal)   Ht 5\' 5"  (1.651 m)   Wt 162 lb 12.8 oz (73.8 kg)   SpO2 98% Comment: RA  BMI 27.09 kg/m   GEN: A/Ox3; pleasant , NAD, well nourished    HEENT:  [place]/AT,   NOSE-clear, THROAT-clear, no lesions, no postnasal drip or exudate noted.   NECK:  Supple w/ fair ROM; no JVD; normal carotid impulses w/o bruits; no thyromegaly or nodules palpated; no lymphadenopathy.    RESP  Clear  P & A; w/o, wheezes/ rales/ or rhonchi. no accessory muscle use, no dullness to percussion  CARD:  RRR, no m/r/g, no peripheral edema, pulses intact, no cyanosis or clubbing.  GI:   Soft & nt; nml bowel sounds; no organomegaly or masses detected.   Musco: Warm bil, no deformities or joint swelling noted.   Neuro: alert, no focal deficits noted.    Skin: Warm, no lesions or rashes    Lab Results:  BMET No results found for: NA, K, CL, CO2, GLUCOSE, BUN, CREATININE, CALCIUM, GFRNONAA, GFRAA  BNP No results found for: BNP  ProBNP No results found for: PROBNP  Imaging: No results found.    PFT Results Latest Ref Rng & Units 02/12/2020  FVC-Pre L 3.37  FVC-Predicted Pre % 95  FVC-Post L 3.39  FVC-Predicted Post % 95  Pre FEV1/FVC % % 73  Post FEV1/FCV % % 74  FEV1-Pre L 2.47  FEV1-Predicted Pre % 89  FEV1-Post L 2.51  DLCO uncorrected ml/min/mmHg 25.74  DLCO UNC% % 120  DLCO corrected ml/min/mmHg 24.25  DLCO COR %Predicted % 113  DLVA  Predicted % 115  TLC L 5.51  TLC % Predicted % 104  RV % Predicted % 104    No results found for: NITRICOXIDE      Assessment & Plan:   No problem-specific Assessment & Plan notes found for this encounter.     04/13/2020, NP 07/09/2020

## 2020-07-09 NOTE — Assessment & Plan Note (Signed)
Mild flare continue on current regimen.  Add saline nasal spray and gel as needed.  Plan  Patient Instructions  Prednisone taper over next week. Take with food.  Continue on BREO 1 puff daily , rinse after use.  Albuterol Inhaler As needed   Continue on Singulair daily.  Saline nasal spray and gel As needed   Follow up with Allergist as planned.  Follow up with Dr. Tonia Brooms in 3 months and As needed   Please contact office for sooner follow up if symptoms do not improve or worsen or seek emergency care

## 2020-07-09 NOTE — Patient Instructions (Addendum)
Prednisone taper over next week. Take with food.  Continue on BREO 1 puff daily , rinse after use.  Albuterol Inhaler As needed   Continue on Singulair daily.  Saline nasal spray and gel As needed   Follow up with Allergist as planned.  Follow up with Dr. Tonia Brooms in 3 months and As needed   Please contact office for sooner follow up if symptoms do not improve or worsen or seek emergency care

## 2020-07-09 NOTE — Assessment & Plan Note (Signed)
Mild exacerbation.  Will give short course of prednisone.  Have advised her to have COVID-19 testing. Continue on trigger prevention.  Plan  Patient Instructions  Prednisone taper over next week. Take with food.  Continue on BREO 1 puff daily , rinse after use.  Albuterol Inhaler As needed   Continue on Singulair daily.  Saline nasal spray and gel As needed   Follow up with Allergist as planned.  Follow up with Dr. Tonia Brooms in 3 months and As needed   Please contact office for sooner follow up if symptoms do not improve or worsen or seek emergency care

## 2020-07-13 NOTE — Telephone Encounter (Signed)
Patient sent email  Hi Carol Acosta, I had a Covid test July 10 2020.  I just received my results.I am NEGATIVE!!! Have a great weekend! Carol Acosta   Sending to [place] as Lorain Childes

## 2020-07-30 ENCOUNTER — Telehealth: Payer: Self-pay

## 2020-07-30 NOTE — Telephone Encounter (Signed)
Left message for pt to return call to triage RN. 

## 2020-07-30 NOTE — Telephone Encounter (Signed)
Spoke with pt. Pt states wanting to go back on the Prempro 0.45mg  due to having vasomotor sx return. Pt states was seen by Dr Edward Jolly in Sept and started weaning down Prempro to 0.3mg  daily and states that she is having hot flashes and vaginal dryness again. States did well on 0.45 mg and taking it every other day  and no symptoms. Pt asking to have refill of 0.45 mg Prempro Rx because she is due to refills now since just now out.   Advised will discuss with Dr Edward Jolly and return call.  Next AEX 06/30/21 Last MMG 05/13/20- Birads 1, Neg   Routing to Dr Edward Jolly, please advise

## 2020-07-30 NOTE — Telephone Encounter (Signed)
New message   Pt c/o medication issue:  1. Name of Medication: Prempro want to resume .45 / 1.5 mg - every other day works much better  2. How are you currently taking this medication (dosage and times per day) .3 mg is not working - taken once a day   3. Are you having a reaction (difficulty breathing--STAT)? No   4. What is your medication .3 mg is not working    5. [facility] / [employer] [employer] [facility]

## 2020-07-30 NOTE — Telephone Encounter (Signed)
OK to stop her PremPro 0.3/1.5 mg and restart PremPro 0.45/1.5 mg daily.  Sig:  1 po daily.  Disp:  90, RF 3.  If she wants to take the PremPro every other day, that is OK. I just want to make sure she has enough if she decides to take it daily!

## 2020-07-31 MED ORDER — PREMPRO 0.45-1.5 MG PO TABS: 1.0000 | ORAL_TABLET | Freq: Every day | ORAL | 3 refills | Status: DC

## 2020-07-31 NOTE — Telephone Encounter (Signed)
Spoke with pt. Pt given update on Rx per Dr Edward Jolly. Pt agreeable and verbalized understanding. Pt thankful for new Rx.  Rx Prempro sent to pharmacy on file. # 90, 3RF.  Encounter closed

## 2020-10-08 ENCOUNTER — Encounter: Payer: Self-pay | Admitting: Pulmonary Disease

## 2020-10-08 ENCOUNTER — Other Ambulatory Visit: Payer: Self-pay

## 2020-10-08 ENCOUNTER — Ambulatory Visit: Payer: BC Managed Care – PPO | Admitting: Pulmonary Disease

## 2020-10-08 VITALS — BP 120/62 | HR 82 | Temp 97.0°F | Ht 65.0 in | Wt 161.4 lb

## 2020-10-08 DIAGNOSIS — J4550 Severe persistent asthma, uncomplicated: Secondary | ICD-10-CM | POA: Diagnosis not present

## 2020-10-08 DIAGNOSIS — Z8616 Personal history of COVID-19: Secondary | ICD-10-CM

## 2020-10-08 DIAGNOSIS — J302 Other seasonal allergic rhinitis: Secondary | ICD-10-CM

## 2020-10-08 DIAGNOSIS — J8283 Eosinophilic asthma: Secondary | ICD-10-CM | POA: Diagnosis not present

## 2020-10-08 DIAGNOSIS — R062 Wheezing: Secondary | ICD-10-CM | POA: Insufficient documentation

## 2020-10-08 MED ORDER — BREZTRI AEROSPHERE 160-9-4.8 MCG/ACT IN AERO: 2.0000 | INHALATION_SPRAY | Freq: Two times a day (BID) | RESPIRATORY_TRACT | 0 refills | Status: DC

## 2020-10-08 NOTE — Patient Instructions (Addendum)
Thank you for visiting Dr. Tonia Brooms at [facility]. Today we recommend the following:  Breztri Samples, use with spacer  Start Dupixent paperwork   Return in about 3 months (around 01/06/2021).    Please do your part to reduce the spread of COVID-19.

## 2020-10-08 NOTE — Progress Notes (Signed)
Synopsis: Referred in March 2021 for asthma by [name], MD  Subjective:   PATIENT ID: Carol Acosta GENDER: female DOB: 08-25-1961, MRN: [id number]  Chief Complaint  Patient presents with  . Follow-up    3 month follow up on Asthma    This is a 59 year old female past medical history of asthma, hypertension hyperlipidemia, allergic rhinitis.  Patient presents to the office for evaluation of asthma. She has had asthma since child. She has had allergy skin testing with multiple positivities. She had September covid mild symptoms, dx sept 3rd. She always gets flare ups in aug, September, again with issues in the spring time. She has seen cardiology for management of HTN and HLD. She retired Nov 1st. Her anxiety is much better down. She still having recurrent flares. She is married, twin 64 years olds, and 58 year old. Currently managed with Breo 200, was on prednisone three times this past year.   OV 01/01/2020: Patient with ongoing respiratory symptoms.  Very anxious today.  She has been trying to work out and start trying to take care of herself.  We talked about various other medications that she has been using.  She uses antihistamines on occasion.  She has been on Singulair in the past but not currently.  Seldom uses her nebulizer or albuterol.  Even though she does state that she probably should when she has symptoms but she does not always reach to use her albuterol.    OV 10/08/2020: Here today for follow-up history of asthma. Patient last seen in the office September 2021 by Rikki Spearing, NP patient was put on a prednisone taper recommend to continue use of Breo.  Today doing well today.  Had a slight flare over the Christmas holiday of able to maintain with just use of as needed albuterol she is religiously using her antihistamines and Singulair.  We reviewed her previous lab work approximately 9 months ago which included a Product manager and CBC which had a absolute eosinophil count of  200 and IgE of 91 and positive environmental allergens.   Past Medical History:  Diagnosis Date  . Allergic rhinitis   . Asthma   . Hiatal hernia 06/2020  . History of COVID-19 06/2019  . Hyperlipidemia   . Hypertension   . Osteopenia      Family History  Problem Relation Age of Onset  . COPD Maternal Grandmother   . Hypertension Maternal Grandmother   . Allergic rhinitis Brother   . Dementia Brother   . Allergic rhinitis Brother   . Asthma Mother   . Allergies Mother   . Hypertension Mother   . Thyroid disease Mother      Past Surgical History:  Procedure Laterality Date  . CESAREAN SECTION  2000  . ENDOMETRIAL ABLATION    . NASAL SINUS SURGERY  1989    Social History   Socioeconomic History  . Marital status: Married    Spouse name: Not on file  . Number of children: 3  . Years of education: Not on file  . Highest education level: Not on file  Occupational History  . 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 Smoking status: Never Smoker  . Smokeless tobacco: Never Used  Vaping Use  . Vaping Use: Never used  Substance and Sexual Activity  . Alcohol use: Not Currently  . Drug use: No  . Sexual activity: Not Currently    Birth control/protection: None  Other Topics Concern  . Not  on file  Social History Narrative  . Not on file   Social Determinants of Health   Financial Resource Strain: Not on file  Food Insecurity: Not on file  Transportation Needs: Not on file  Physical Activity: Not on file  Stress: Not on file  Social Connections: Not on file  Intimate Partner Violence: Not on file     No Known Allergies   Outpatient Medications Prior to Visit  Medication Sig Dispense Refill  . albuterol (PROVENTIL) (2.5 MG/3ML) 0.083% nebulizer solution     . albuterol (VENTOLIN HFA) 108 (90 Base) MCG/ACT inhaler Inhale 1 puff into the lungs as needed.    Marland Kitchen. BREO ELLIPTA 200-25 MCG/INH AEPB Inhale 1 puff into the lungs daily.    .  clotrimazole-betamethasone (LOTRISONE) cream Apply 1 application topically as needed.     Marland Kitchen. EPINEPHrine 0.3 mg/0.3 mL IJ SOAJ injection Inject into the muscle as directed.    . estrogen, conjugated,-medroxyprogesterone (PREMPRO) 0.45-1.5 MG tablet Take 1 tablet by mouth daily. 90 tablet 3  . fluticasone (FLONASE) 50 MCG/ACT nasal spray Place 2 sprays into both nostrils daily. 16 g 2  . montelukast (SINGULAIR) 10 MG tablet Take 1 tablet (10 mg total) by mouth at bedtime. 30 tablet 11  . olmesartan-hydrochlorothiazide (BENICAR HCT) 40-12.5 MG tablet Take 1 tablet by mouth daily.    Marland Kitchen. omeprazole (PRILOSEC) 20 MG capsule Take 20 mg by mouth 2 (two) times daily.    Marland Kitchen. OVER THE COUNTER MEDICATION [facility] colon [facility] Takes 1 daily    . rosuvastatin (CRESTOR) 20 MG tablet TAKE 1 TABLET ONCE DAILY. 90 tablet 3  . Spacer/Aero-Holding Chambers (AEROCHAMBER MV) inhaler Use as instructed 1 each 0  . diltiazem (CARDIZEM CD) 120 MG 24 hr capsule TAKE (1) CAPSULE DAILY. (Patient not taking: Reported on 10/08/2020) 90 capsule 3  . predniSONE (DELTASONE) 10 MG tablet 4 tabs for 2 days, then 3 tabs for 2 days, 2 tabs for 2 days, then 1 tab for 2 days, then stop (Patient not taking: Reported on 10/08/2020) 20 tablet 0   No facility-administered medications prior to visit.    Review of Systems  Constitutional: Negative for chills, fever, malaise/fatigue and weight loss.  HENT: Negative for hearing loss, sore throat and tinnitus.   Eyes: Negative for blurred vision and double vision.  Respiratory: Positive for cough, sputum production and shortness of breath. Negative for hemoptysis, wheezing and stridor.   Cardiovascular: Negative for chest pain, palpitations, orthopnea, leg swelling and PND.  Gastrointestinal: Negative for abdominal pain, constipation, diarrhea, heartburn, nausea and vomiting.  Genitourinary: Negative for dysuria, hematuria and urgency.  Musculoskeletal: Negative for joint pain and myalgias.   Skin: Negative for itching and rash.  Neurological: Negative for dizziness, tingling, weakness and headaches.  Endo/Heme/Allergies: Negative for environmental allergies. Does not bruise/bleed easily.  Psychiatric/Behavioral: Negative for depression. The patient is not nervous/anxious and does not have insomnia.   All other systems reviewed and are negative.    Objective:  Physical Exam Vitals reviewed.  Constitutional:      General: She is not in acute distress.    Appearance: She is well-developed and well-nourished.  HENT:     Head: Normocephalic and atraumatic.     Mouth/Throat:     Mouth: Oropharynx is clear and moist.  Eyes:     General: No scleral icterus.    Conjunctiva/sclera: Conjunctivae normal.     Pupils: Pupils are equal, round, and reactive to light.  Neck:     Vascular:  No JVD.     Trachea: No tracheal deviation.  Cardiovascular:     Rate and Rhythm: Normal rate and regular rhythm.     Pulses: Intact distal pulses.     Heart sounds: Normal heart sounds. No murmur heard.   Pulmonary:     Effort: Pulmonary effort is normal. No tachypnea, accessory muscle usage or respiratory distress.     Breath sounds: No stridor. No wheezing, rhonchi or rales.  Abdominal:     General: Bowel sounds are normal. There is no distension.     Palpations: Abdomen is soft.     Tenderness: There is no abdominal tenderness.  Musculoskeletal:        General: No tenderness or edema.     Cervical back: Neck supple.  Lymphadenopathy:     Cervical: No cervical adenopathy.  Skin:    General: Skin is warm and dry.     Capillary Refill: Capillary refill takes less than 2 seconds.     Findings: No rash.  Neurological:     Mental Status: She is alert and oriented to person, place, and time.  Psychiatric:        Mood and Affect: Mood and affect normal.        Behavior: Behavior normal.      Vitals:   10/08/20 0856  BP: 120/62  Pulse: 82  Temp: (!) 97 F (36.1 C)  TempSrc:  Tympanic  SpO2: 98%  Weight: 161 lb 6 oz (73.2 kg)  Height: 5\' 5"  (1.651 m)   98% on RA BMI Readings from Last 3 Encounters:  10/08/20 26.85 kg/m  07/09/20 27.09 kg/m  06/25/20 27.62 kg/m   Wt Readings from Last 3 Encounters:  10/08/20 161 lb 6 oz (73.2 kg)  07/09/20 162 lb 12.8 oz (73.8 kg)  06/25/20 166 lb (75.3 kg)     CBC    Component Value Date/Time   WBC 11.5 (H) 01/01/2020 0958   RBC 5.39 (H) 01/01/2020 0958   HGB 15.6 (H) 01/01/2020 0958   HCT 47.1 (H) 01/01/2020 0958   PLT 263.0 01/01/2020 0958   MCV 87.4 01/01/2020 0958   MCHC 33.2 01/01/2020 0958   RDW 13.4 01/01/2020 0958   LYMPHSABS 3.3 01/01/2020 0958   MONOABS 0.8 01/01/2020 0958   EOSABS 0.2 01/01/2020 0958   BASOSABS 0.0 01/01/2020 0958     Chest Imaging: 05/24/2011 chest x-ray: Evidence of hyperinflation no infiltrate.  Pulmonary Functions Testing Results: PFT Results Latest Ref Rng & Units 02/12/2020  FVC-Pre L 3.37  FVC-Predicted Pre % 95  FVC-Post L 3.39  FVC-Predicted Post % 95  Pre FEV1/FVC % % 73  Post FEV1/FCV % % 74  FEV1-Pre L 2.47  FEV1-Predicted Pre % 89  FEV1-Post L 2.51  DLCO uncorrected ml/min/mmHg 25.74  DLCO UNC% % 120  DLCO corrected ml/min/mmHg 24.25  DLCO COR %Predicted % 113  DLVA Predicted % 115  TLC L 5.51  TLC % Predicted % 104  RV % Predicted % 104    FeNO: None  Pathology: None   Echocardiogram:  08/05/2019: Normal ejection fraction 65 to 70%, grade 1 diastolic dysfunction.  Heart Catheterization: None     Assessment & Plan:     ICD-10-CM   1. 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 Seasonal allergies  J30.2   4. History of COVID-19  Z86.16     Discussion:  59 year old female history of asthma since childhood, recurrent exacerbations has  been on steroids several times, history of COVID-19 in the past.  She has a positive Rast panel and eosinophil count of 200.  Currently on Breo 200 with  ongoing breakthrough symptoms despite compliance with medications including Singulair plus antihistamines.  She also is getting allergy shots from Dr. [place] Callas.  Plan: At this point I think her next best step was to consider triple therapy inhaler she had tried Trelegy in the past but she had some urinary hesitancy symptoms. I think we can give her a shot at using breztri to be used with a spacer. Continue albuterol. Continue Singulair Continue antihistamine Continue PPI  Start paperwork for approval for Dupixent.  Today in the office we discussed the risk benefits and alternatives of starting biologic injection therapy for severe asthma.  I think she be a good candidate for this and will improve her symptomatology.  She also has significant nasal and sinus symptoms which I think she had benefit from this medication.    Current Outpatient Medications:  .  albuterol (PROVENTIL) (2.5 MG/3ML) 0.083% nebulizer solution, , Disp: , Rfl:  .  albuterol (VENTOLIN HFA) 108 (90 Base) MCG/ACT inhaler, Inhale 1 puff into the lungs as needed., Disp: , Rfl:  .  BREO ELLIPTA 200-25 MCG/INH AEPB, Inhale 1 puff into the lungs daily., Disp: , Rfl:  .  clotrimazole-betamethasone (LOTRISONE) cream, Apply 1 application topically as needed. , Disp: , Rfl:  .  EPINEPHrine 0.3 mg/0.3 mL IJ SOAJ injection, Inject into the muscle as directed., Disp: , Rfl:  .  estrogen, conjugated,-medroxyprogesterone (PREMPRO) 0.45-1.5 MG tablet, Take 1 tablet by mouth daily., Disp: 90 tablet, Rfl: 3 .  fluticasone (FLONASE) 50 MCG/ACT nasal spray, Place 2 sprays into both nostrils daily., Disp: 16 g, Rfl: 2 .  montelukast (SINGULAIR) 10 MG tablet, Take 1 tablet (10 mg total) by mouth at bedtime., Disp: 30 tablet, Rfl: 11 .  olmesartan-hydrochlorothiazide (BENICAR HCT) 40-12.5 MG tablet, Take 1 tablet by mouth daily., Disp: , Rfl:  .  omeprazole (PRILOSEC) 20 MG capsule, Take 20 mg by mouth 2 (two) times daily., Disp: , Rfl:  .  OVER  THE COUNTER MEDICATION, [facility] Takes 1 daily, Disp: , Rfl:  .  rosuvastatin (CRESTOR) 20 MG tablet, TAKE 1 TABLET ONCE DAILY., Disp: 90 tablet, Rfl: 3 .  Spacer/Aero-Holding Chambers (AEROCHAMBER MV) inhaler, Use as instructed, Disp: 1 each, Rfl: 0  I spent 40 minutes dedicated to the care of this patient on the date of this encounter to include pre-visit review of records, face-to-face time with the patient discussing conditions above, post visit ordering of testing, clinical documentation with the electronic health record, making appropriate referrals as documented, and communicating necessary findings to members of the patients care team.    Josephine Igo, DO [facility] 10/08/2020 9:05 AM

## 2020-11-17 ENCOUNTER — Telehealth: Payer: Self-pay | Admitting: Pharmacy Technician

## 2020-11-17 NOTE — Telephone Encounter (Signed)
Received notification for new start paperwork for DUPIXENT. Will update as we work through the benefits process.

## 2020-11-18 NOTE — Telephone Encounter (Signed)
error 

## 2020-11-19 NOTE — Telephone Encounter (Signed)
Submitted a Prior Authorization request to [facility] for DUPIXENT 300mg  PEN via Cover My Meds. Will update once we receive a response.  PA#   Per plan, patient must fill through [facility]  Dupixent Copay Card:  [place]- [phone] PCN- Loyalty ID# [id number] Group- [id number]

## 2020-11-19 NOTE — Telephone Encounter (Signed)
ATC patient regarding approval. Left VM regarding approval.  Copay card information that must be activated prior to using [phone] or [email or web address]): BIN: [id number] PCN: [id number] Group: [id number] ID: [id number]  Will call patient again early next week.  Chesley Mires, PharmD, MPH Clinical Pharmacist (Rheumatology and Pulmonology)

## 2020-11-19 NOTE — Telephone Encounter (Signed)
Received notification from [facility] regarding a prior authorization for DUPIXENT. Authorization has been APPROVED from 11/19/20 to 05/19/21.   Authorization Phone

## 2020-11-20 ENCOUNTER — Telehealth: Payer: Self-pay | Admitting: Pulmonary Disease

## 2020-11-20 NOTE — Telephone Encounter (Signed)
Patient scheduled for Dupixent new start on 11/23/20 @ 1:20pm with pharmacist. Patient has Epipen on hand. Reviewed required monitoring period.

## 2020-11-20 NOTE — Telephone Encounter (Signed)
Patient scheduled for Dupixent new start on 11/23/20 @ 1:20pm with pharmacist. Patient has Epipen on hand. Reviewed required monitoring period. 

## 2020-11-23 ENCOUNTER — Other Ambulatory Visit: Payer: Self-pay

## 2020-11-23 ENCOUNTER — Ambulatory Visit: Payer: BC Managed Care – PPO | Admitting: Pharmacist

## 2020-11-23 DIAGNOSIS — J8283 Eosinophilic asthma: Secondary | ICD-10-CM

## 2020-11-23 DIAGNOSIS — J4550 Severe persistent asthma, uncomplicated: Secondary | ICD-10-CM

## 2020-11-23 MED ORDER — DUPIXENT 300 MG/2ML ~~LOC~~ SOAJ: 300.0000 mg | SUBCUTANEOUS | 5 refills | Status: DC

## 2020-11-23 NOTE — Patient Instructions (Addendum)
Your Dupixent dose will be 300mg  (1 pen) every 14 days. Your next dose is due 12/07/20, 12/21/20, 01/04/21, and every 2 weeks thereafter.  Your [facility]ent will come from [facility]. Their pharmacy phone number is [phone]. Your Dupixent should be affordable. If it is not, please provide the pharmacy with the copay card information as follows:  [place]- [phone] PCN- Loyalty ID# [id number] Group- [id number]  Remember the 5 C's:  COUNTER - leave on the counter at least 45 minutes but up to overnight to bring medication to room temperature. This may help prevent stinging  COLD - place something cold (like an ice gel pack or cold water bottle) on the injection site just before cleansing with alcohol. This may help reduce pain  CLARITIN - use Claritin (generic name is loratadine) for the first two weeks of treatment or the day of, the day before, and the day after injecting. This will help to minimize injection site reactions  CORTISONE CREAM - apply if injection site is irritated and itching  CALL ME - if injection site reaction is bigger than the size of your fist, looks infected, blisters, or if you develop hives   Dupilumab injection What is this medicine? DUPILUMAB (doo PIL ue mab) is an injection used to treat certain patients with eczema, asthma, and sinus inflammation with nasal polyps. This medicine may be used for other purposes; ask your health care provider or pharmacist if you have questions. COMMON BRAND NAME(S): DUPIXENT What should I tell my health care provider before I take this medicine? They need to know if you have any of these conditions:  asthma  parasitic (helminth) infection  an unusual or allergic reaction to dupilumab, other medicines, foods, dyes, or preservatives  pregnant or trying to get pregnant  breast-feeding How should I use this medicine? This medicine is for injection under the skin. You will be taught how to prepare and give this medicine.  Use exactly as directed. Take your medicine at regular intervals. Do not take your medicine more often than directed. It is important that you put your used needles and syringes in a special sharps container. Do not put them in a trash can. If you do not have a sharps container, call your pharmacist or healthcare provider to get one. Talk to your pediatrician regarding the use of this medicine in children. While this medicine may be prescribed for children as young as 6 years for selected conditions, precautions do apply. Overdosage: If you think you have taken too much of this medicine contact a poison control center or emergency room at once. NOTE: This medicine is only for you. Do not share this medicine with others. What if I miss a dose? If you miss a dose, take it as soon as you can if it is within 7 days from the missed dose. If it is more than 7 days from your last dose and you are on an every other week dosing schedule, skip the dose and wait to take the next dose on the original schedule. If it is more than 7 days from your last dose and you are on an every 4-week dosing schedule, take a dose as soon as possible and start a new schedule based on this date. Do not take double or extra doses. What may interact with this medicine?  vaccines  warfarin This list may not describe all possible interactions. Give your health care provider a list of all the medicines, herbs, non-prescription drugs, or dietary supplements  you use. Also tell them if you smoke, drink alcohol, or use illegal drugs. Some items may interact with your medicine. What should I watch for while using this medicine? Tell your doctor or healthcare professional if your symptoms do not start to get better or if they get worse. You should not receive certain vaccines while using this medicine. Dupilumab may prevent a vaccine from working. Talk to your health care provider if you need to receive a vaccine while using this  medicine. What side effects may I notice from receiving this medicine? Side effects that you should report to your doctor or health care professional as soon as possible:  allergic reactions like skin rash, itching or hives, swelling of the face, lips, or tongue  changes in vision  eye pain or swelling Side effects that usually do not require medical attention (report these to your doctor or health care professional if they continue or are bothersome):  cold sores  dry or itching eyes  pain, redness, or irritation at site where injected This list may not describe all possible side effects. Call your doctor for medical advice about side effects. You may report side effects to FDA at 1-800-FDA-1088.  Where should I keep my medicine? Keep out of the reach of children. Store this medicine in the refrigerator between 2 and 8 degrees C (36 and 46 degrees F) until you are ready to prepare your injection; do not freeze. You may also store at room temperature for up to 14 days if needed. Do not store above 25 degrees C (77 degrees F) or expose to heat. Throw away any unused medicine after the expiration date. NOTE: This sheet is a summary. It may not cover all possible information. If you have questions about this medicine, talk to your doctor, pharmacist, or health care provider.  2021 Elsevier/Gold Standard (2019-04-01 11:55:26)

## 2020-11-23 NOTE — Progress Notes (Signed)
HPI Patient presents today to [facility] to see pharmacy team for Dupixent new start.  Past medical history includes asthma, allergic rhinitis, and HTN. Patient has Epipen on hand today (in date) - no history of anaphylactic reactions.  Respiratory Medications Current: Breztri inhaler (switched from [facility] in December), montelukast 10mg  once daily, and fluticasone nasal spray (2 puffs in each nostril daily)  Tried in past: Trelegy, recently started on Breztri but reports that works just as well as 12-06-1971 (she prefers Breo due to less puffs needed daily)  Patient reports no known adherence challenges  OBJECTIVE No Known Allergies  Outpatient Encounter Medications as of 11/23/2020  Medication Sig  . albuterol (PROVENTIL) (2.5 MG/3ML) 0.083% nebulizer solution   . albuterol (VENTOLIN HFA) 108 (90 Base) MCG/ACT inhaler Inhale 1 puff into the lungs as needed.  11/25/2020 BREO ELLIPTA 200-25 MCG/INH AEPB Inhale 1 puff into the lungs daily.  . Budeson-Glycopyrrol-Formoterol (BREZTRI AEROSPHERE) 160-9-4.8 MCG/ACT AERO Inhale 2 puffs into the lungs in the morning and at bedtime.  . clotrimazole-betamethasone (LOTRISONE) cream Apply 1 application topically as needed.   Marland Kitchen EPINEPHrine 0.3 mg/0.3 mL IJ SOAJ injection Inject into the muscle as directed.  . estrogen, conjugated,-medroxyprogesterone (PREMPRO) 0.45-1.5 MG tablet Take 1 tablet by mouth daily.  . fluticasone (FLONASE) 50 MCG/ACT nasal spray Place 2 sprays into both nostrils daily.  . montelukast (SINGULAIR) 10 MG tablet Take 1 tablet (10 mg total) by mouth at bedtime.  Marland Kitchen olmesartan-hydrochlorothiazide (BENICAR HCT) 40-12.5 MG tablet Take 1 tablet by mouth daily.  Marland Kitchen omeprazole (PRILOSEC) 20 MG capsule Take 20 mg by mouth 2 (two) times daily.  Marland Kitchen OVER THE COUNTER MEDICATION Marland Kitchen colon health Takes 1 daily  . rosuvastatin (CRESTOR) 20 MG tablet TAKE 1 TABLET ONCE DAILY.  Vear Clock Spacer/Aero-Holding Chambers (AEROCHAMBER MV) inhaler Use as  instructed   No facility-administered encounter medications on file as of 11/23/2020.     Immunization History  Administered Date(s) Administered  . Influenza Inj Mdck Quad With Preservative 07/10/2017  . Influenza, High Dose Seasonal PF 10/12/2018, 08/30/2019  . Influenza,inj,Quad PF,6+ Mos 07/11/2019  . Influenza-Unspecified 07/10/2016  . PFIZER(Purple Top)SARS-COV-2 Vaccination 12/24/2019, 01/15/2020, 08/14/2020     PFTs PFT Results Latest Ref Rng & Units 02/12/2020  FVC-Pre L 3.37  FVC-Predicted Pre % 95  FVC-Post L 3.39  FVC-Predicted Post % 95  Pre FEV1/FVC % % 73  Post FEV1/FCV % % 74  FEV1-Pre L 2.47  FEV1-Predicted Pre % 89  FEV1-Post L 2.51  DLCO uncorrected ml/min/mmHg 25.74  DLCO UNC% % 120  DLCO corrected ml/min/mmHg 24.25  DLCO COR %Predicted % 113  DLVA Predicted % 115  TLC L 5.51  TLC % Predicted % 104  RV % Predicted % 104     Eosinophils Most recent blood eosinophil count was 200 cells/microL taken on 01/01/20.   Assessment   1. Biologics training (Dupixent)  o MOA: a human monoclonal IgG4 antibody that inhibits interleukin-4 and interleukin-13 cytokine-induced responses, including release of proinflammatory cytokines, chemokines, and IgE o Response to therapy: may take several weeks to months. Advised that we can assess effectiveness based on rescue inhaler use, oral corticosteroid need, as well as symptomatic improvement via PFTs and quality of life measures. Patient verbalized understanding. o Side effects: injection site reaction (6-18%), antibody development (5-16%), ophthalmic conjunctivitis (2-16%), transient blood eosinophilia (1-2%) o Dosing: 600 mg followed by 300 mg subQ every 14 days o Reviewed storage in refrigeration. Discussed that pen can sit at room temperature for up  to 14 days, but must be discarded after 14 days at room temp. Reviewed appropriate disposal of sharps including sharps container or using laundry detergent/bleach bottle and  safely disposing per [place] public health guidelines. o Reviewed injection site reaction management. o Administration:  1. Do not shake.  2. Do not use if solution is discolored or contains particulate matter or if window on prefilled pen is yellow (indicates pen has been used).  1. Administer as a subcutaneous injection into the thigh or lower abdomen (avoiding areas within 2 inches of navel); caregiver may administer in upper arm.  2. Rotate injection sites, including initial doses (administer 600 mg initial dose as two 300 mg injections at different sites).  Discussed avoiding varicose veins, scars, and any bumps.  Drug name: Dupixent 300mg /101mL penx Qty: 2 pens LOT: [id number] Exp.Date: 07/09/22  First injection administered by patient into right thigh. Second injection administered by patient into left thigh.  2. Medication Reconciliation  A drug regimen assessment was performed, including review of allergies, interactions, disease-state management, dosing and immunization history. Medications were reviewed with the patient, including name, instructions, indication, goals of therapy, potential side effects, importance of adherence, and safe use.  Drug interaction(s): no clinically significant interactions  PLAN Continue Dupixent 300mg  every 14 days. Next dose due 12/07/20 then every 14 days thereafter. Rx sent to [facility] along with copay card information. Patient provided with physical copy of copay card, pharmacy phone number, and advised to provide copay card information to [facility] if they quote her an unaffordable copay. Advised her to call for refills when she uses last pen which allows 2 weeks to ensure medication will reach her on time and any issues can be resolved to prevent delay in doses.  Continue Breo Ellipta 200/25 mcg 1 puff once daily, montelukast 10mg  daily, and fluticasone nasal spray daily. Stop Breztri since it has worked no better than Breo for  asthma symptoms.  All questions encouraged and answered.  Instructed patient to reach out with any further questions or concerns.  Thank you for allowing pharmacy to participate in this patient's care.  , PharmD, MPH Clinical Pharmacist (Rheumatology and Pulmonology)

## 2020-11-24 NOTE — Telephone Encounter (Signed)
Lisa, please advise if this message can be closed. Thanks.  

## 2020-12-09 ENCOUNTER — Telehealth: Payer: Self-pay | Admitting: Pulmonary Disease

## 2020-12-09 NOTE — Telephone Encounter (Signed)
Please have patient report to dupixent nurse/resource line.  Ok to take benadryl  Ok to use cortisone cream   Ask patient to photograph hives if she is willing.   Josephine Igo, DO [facility] 12/09/2020 1:25 PM

## 2020-12-09 NOTE — Telephone Encounter (Signed)
Call returned to patient, confirmed DOB. Patient first injection was done in the office, in her leg, she had a reaction 48 hours after the first injection. She states both her legs developed hives and this lasted about a week before it started to fade.   She took her 2nd injection Sunday in her stomach. On Tuesday she woke up with the hives at the injection site. She reports the area is red, swollen, and has hives but a bit worse this time than last time.   She has used the OCT cortizone cream which has not helped. She iced it and has taken advil. She has not taken benadryl due to her working. She reports she is taken anti-histamine clarinex daily. She reports it is itching but it is manageable.   Denies SOB. She reports her breathing is great however she is concerned about the reactions.   BI please advise as BI is not in office. Thanks :)

## 2020-12-09 NOTE — Telephone Encounter (Signed)
Spoke with patient. She verbalized understanding of BI recs. 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   She is aware to call us if anything changes.    Nothing further needed at time of call.

## 2021-01-07 ENCOUNTER — Ambulatory Visit: Payer: BC Managed Care – PPO | Admitting: Pulmonary Disease

## 2021-01-07 ENCOUNTER — Other Ambulatory Visit: Payer: Self-pay

## 2021-01-07 ENCOUNTER — Encounter: Payer: Self-pay | Admitting: Pulmonary Disease

## 2021-01-07 VITALS — BP 116/78 | HR 82 | Temp 97.4°F | Ht 65.0 in | Wt 157.2 lb

## 2021-01-07 DIAGNOSIS — J8283 Eosinophilic asthma: Secondary | ICD-10-CM | POA: Diagnosis not present

## 2021-01-07 DIAGNOSIS — R768 Other specified abnormal immunological findings in serum: Secondary | ICD-10-CM

## 2021-01-07 DIAGNOSIS — J4550 Severe persistent asthma, uncomplicated: Secondary | ICD-10-CM

## 2021-01-07 DIAGNOSIS — Z8616 Personal history of COVID-19: Secondary | ICD-10-CM | POA: Insufficient documentation

## 2021-01-07 DIAGNOSIS — J302 Other seasonal allergic rhinitis: Secondary | ICD-10-CM

## 2021-01-07 DIAGNOSIS — T7849XA Other allergy, initial encounter: Secondary | ICD-10-CM

## 2021-01-07 DIAGNOSIS — J309 Allergic rhinitis, unspecified: Secondary | ICD-10-CM

## 2021-01-07 DIAGNOSIS — J45909 Unspecified asthma, uncomplicated: Secondary | ICD-10-CM

## 2021-01-07 DIAGNOSIS — L509 Urticaria, unspecified: Secondary | ICD-10-CM

## 2021-01-07 MED ORDER — ALBUTEROL SULFATE (2.5 MG/3ML) 0.083% IN NEBU: 2.5000 mg | INHALATION_SOLUTION | Freq: Four times a day (QID) | RESPIRATORY_TRACT | 11 refills | Status: DC | PRN

## 2021-01-07 MED ORDER — MONTELUKAST SODIUM 10 MG PO TABS: 10.0000 mg | ORAL_TABLET | Freq: Every day | ORAL | 11 refills | Status: DC

## 2021-01-07 MED ORDER — ALBUTEROL SULFATE HFA 108 (90 BASE) MCG/ACT IN AERS: 1.0000 | INHALATION_SPRAY | RESPIRATORY_TRACT | 6 refills | Status: DC | PRN

## 2021-01-07 MED ORDER — DESLORATADINE 5 MG PO TABS: 5.0000 mg | ORAL_TABLET | Freq: Every day | ORAL | 11 refills | Status: DC

## 2021-01-07 MED ORDER — BREO ELLIPTA 200-25 MCG/INH IN AEPB: 1.0000 | INHALATION_SPRAY | Freq: Every day | RESPIRATORY_TRACT | 3 refills | Status: AC

## 2021-01-07 NOTE — Progress Notes (Signed)
Synopsis: Referred in March 2021 for asthma by [name], MD  Subjective:   PATIENT ID: Carol Acosta GENDER: female DOB: January 09, 1961, MRN: [id number]  Chief Complaint  Patient presents with  . Follow-up    Requesting refills.     This is a 60 year old female past medical history of asthma, hypertension hyperlipidemia, allergic rhinitis.  Patient presents to the office for evaluation of asthma. She has had asthma since child. She has had allergy skin testing with multiple positivities. She had September covid mild symptoms, dx sept 3rd. She always gets flare ups in aug, September, again with issues in the spring time. She has seen cardiology for management of HTN and HLD. She retired Nov 1st. Her anxiety is much better down. She still having recurrent flares. She is married, twin 37 years olds, and 59 year old. Currently managed with Breo 200, was on prednisone three times this past year.   OV 01/01/2020: Patient with ongoing respiratory symptoms.  Very anxious today.  She has been trying to work out and start trying to take care of herself.  We talked about various other medications that she has been using.  She uses antihistamines on occasion.  She has been on Singulair in the past but not currently.  Seldom uses her nebulizer or albuterol.  Even though she does state that she probably should when she has symptoms but she does not always reach to use her albuterol.    OV 10/08/2020: Here today for follow-up history of asthma. Patient last seen in the office September 2021 by Rikki Spearing, NP patient was put on a prednisone taper recommend to continue use of Breo.  Today doing well today.  Had a slight flare over the Christmas holiday of able to maintain with just use of as needed albuterol she is religiously using her antihistamines and Singulair.  We reviewed her previous lab work approximately 9 months ago which included a Product manager and CBC which had a absolute eosinophil count of 200  and IgE of 91 and positive environmental allergens.  OV 01/07/2021: Patient recently started Dupixent injections.  She did develop small amount of hives at the injection site.  She treated this with over-the-counter cortisone cream.  At this point she thinks her breathing is 10 times better than where she was before.  Still takes her antihistamines daily.  The site injection reaction with the small hives are getting better.  After her last injection that was smaller than before.  She was taking Benadryl post injection and this made the hives much more tolerable.  She is on a continue to watch this over time.  She is not concerned about it at this point.  We looked at her site injection today.  The one on her abdomen is small.  Past Medical History:  Diagnosis Date  . Allergic rhinitis   . Asthma   . Hiatal hernia 06/2020  . History of COVID-19 06/2019  . Hyperlipidemia   . Hypertension   . Osteopenia      Family History  Problem Relation Age of Onset  . COPD Maternal Grandmother   . Hypertension Maternal Grandmother   . Allergic rhinitis Brother   . Dementia Brother   . Allergic rhinitis Brother   . Asthma Mother   . Allergies Mother   . Hypertension Mother   . Thyroid disease Mother      Past Surgical History:  Procedure Laterality Date  . CESAREAN SECTION  2000  . ENDOMETRIAL ABLATION    .  NASAL SINUS SURGERY  1989    Social History   Socioeconomic History  . Marital status: Married    Spouse name: Not on file  . Number of children: 3  . Years of education: Not on file  . Highest education level: Not on file  Occupational History  . 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 Smoking status: Never Smoker  . Smokeless tobacco: Never Used  Vaping Use  . Vaping Use: Never used  Substance and Sexual Activity  . Alcohol use: Not Currently  . Drug use: No  . Sexual activity: Not Currently    Birth control/protection: None  Other Topics Concern  . Not on file   Social History Narrative  . Not on file   Social Determinants of Health   Financial Resource Strain: Not on file  Food Insecurity: Not on file  Transportation Needs: Not on file  Physical Activity: Not on file  Stress: Not on file  Social Connections: Not on file  Intimate Partner Violence: Not on file     No Known Allergies   Outpatient Medications Prior to Visit  Medication Sig Dispense Refill  . albuterol (PROVENTIL) (2.5 MG/3ML) 0.083% nebulizer solution     . albuterol (VENTOLIN HFA) 108 (90 Base) MCG/ACT inhaler Inhale 1 puff into the lungs as needed.    Marland Kitchen BREO ELLIPTA 200-25 MCG/INH AEPB Inhale 1 puff into the lungs daily.    . clotrimazole-betamethasone (LOTRISONE) cream Apply 1 application topically as needed.     . desloratadine (CLARINEX) 5 MG tablet Take 5 mg by mouth daily.    . Dupilumab (DUPIXENT) 300 MG/2ML SOPN Inject 300 mg into the skin every 14 (fourteen) days. Copay card: Johnney Killian- [id number], PCN- LOYALTY, Group- [id number], ID- [id number] 4 mL 5  . EPINEPHrine 0.3 mg/0.3 mL IJ SOAJ injection Inject into the muscle as directed.    . estrogen, conjugated,-medroxyprogesterone (PREMPRO) 0.45-1.5 MG tablet Take 1 tablet by mouth daily. 90 tablet 3  . fluticasone (FLONASE) 50 MCG/ACT nasal spray Place 2 sprays into both nostrils daily. 16 g 2  . montelukast (SINGULAIR) 10 MG tablet Take 1 tablet (10 mg total) by mouth at bedtime. 30 tablet 11  . olmesartan-hydrochlorothiazide (BENICAR HCT) 40-12.5 MG tablet Take 1 tablet by mouth daily.    Marland Kitchen OVER THE COUNTER MEDICATION Vear Clock colon health Takes 1 daily    . rosuvastatin (CRESTOR) 20 MG tablet TAKE 1 TABLET ONCE DAILY. 90 tablet 3  . Spacer/Aero-Holding Chambers (AEROCHAMBER MV) inhaler Use as instructed 1 each 0  . omeprazole (PRILOSEC) 20 MG capsule Take 20 mg by mouth 2 (two) times daily. (Patient not taking: Reported on 01/07/2021)     No facility-administered medications prior to visit.    Review of Systems   Constitutional: Negative for chills, fever, malaise/fatigue and weight loss.  HENT: Negative for hearing loss, sore throat and tinnitus.   Eyes: Negative for blurred vision and double vision.  Respiratory: Positive for shortness of breath. Negative for cough, hemoptysis, sputum production, wheezing and stridor.   Cardiovascular: Negative for chest pain, palpitations, orthopnea, leg swelling and PND.  Gastrointestinal: Negative for abdominal pain, constipation, diarrhea, heartburn, nausea and vomiting.  Genitourinary: Negative for dysuria, hematuria and urgency.  Musculoskeletal: Negative for joint pain and myalgias.  Skin: Negative for itching and rash.  Neurological: Negative for dizziness, tingling, weakness and headaches.  Endo/Heme/Allergies: Negative for environmental allergies. Does not bruise/bleed easily.  Psychiatric/Behavioral: Negative for depression. The patient is not nervous/anxious and does not  have insomnia.   All other systems reviewed and are negative.      Objective:  Physical Exam  General appearance: 60 y.o., female, NAD, conversant  Eyes: anicteric sclerae, moist conjunctivae; no lid-lag; PERRLA, tracking appropriately HENT: NCAT; oropharynx, MMM, no mucosal ulcerations; normal hard and soft palate Neck: Trachea midline; FROM, supple, lymphadenopathy, no JVD Lungs: CTAB, no crackles, no wheeze, with normal respiratory effort and no intercostal retractions CV: RRR, S1, S2, no MRGs  Abdomen: Soft, non-tender; non-distended, BS present  Extremities: No peripheral edema, radial and DP pulses present bilaterally  Skin: Slight injection on abdomen no raised hive, there is some slight pigmentation difference. Psych: Appropriate affect Neuro: Alert and oriented to person and place, no focal deficit    Vitals:   01/07/21 0903  BP: 116/78  Pulse: 82  Temp: (!) 97.4 F (36.3 C)  TempSrc: Temporal  SpO2: 99%  Weight: 157 lb 3.2 oz (71.3 kg)  Height: 5\' 5"  (1.651  m)   99% on RA BMI Readings from Last 3 Encounters:  01/07/21 26.16 kg/m  10/08/20 26.85 kg/m  07/09/20 27.09 kg/m   Wt Readings from Last 3 Encounters:  01/07/21 157 lb 3.2 oz (71.3 kg)  10/08/20 161 lb 6 oz (73.2 kg)  07/09/20 162 lb 12.8 oz (73.8 kg)     CBC    Component Value Date/Time   WBC 11.5 (H) 01/01/2020 0958   RBC 5.39 (H) 01/01/2020 0958   HGB 15.6 (H) 01/01/2020 0958   HCT 47.1 (H) 01/01/2020 0958   PLT 263.0 01/01/2020 0958   MCV 87.4 01/01/2020 0958   MCHC 33.2 01/01/2020 0958   RDW 13.4 01/01/2020 0958   LYMPHSABS 3.3 01/01/2020 0958   MONOABS 0.8 01/01/2020 0958   EOSABS 0.2 01/01/2020 0958   BASOSABS 0.0 01/01/2020 0958     Chest Imaging: 05/24/2011 chest x-ray: Evidence of hyperinflation no infiltrate.  Pulmonary Functions Testing Results: PFT Results Latest Ref Rng & Units 02/12/2020  FVC-Pre L 3.37  FVC-Predicted Pre % 95  FVC-Post L 3.39  FVC-Predicted Post % 95  Pre FEV1/FVC % % 73  Post FEV1/FCV % % 74  FEV1-Pre L 2.47  FEV1-Predicted Pre % 89  FEV1-Post L 2.51  DLCO uncorrected ml/min/mmHg 25.74  DLCO UNC% % 120  DLCO corrected ml/min/mmHg 24.25  DLCO COR %Predicted % 113  DLVA Predicted % 115  TLC L 5.51  TLC % Predicted % 104  RV % Predicted % 104    FeNO: None  Pathology: None   Echocardiogram:  08/05/2019: Normal ejection fraction 65 to 70%, grade 1 diastolic dysfunction.  Heart Catheterization: None     Assessment & Plan:     ICD-10-CM   1. 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 Seasonal allergies  J30.2   4. History of COVID-19  Z86.16   5. Asthma, intrinsic  J45.909   6. Allergic rhinitis, unspecified seasonality, unspecified trigger  J30.9   7. Hives  L50.9   8. Positive radioallergosorbent test (RAST)  T78.49XA   9. Elevated IgE level  R76.8     Discussion:  This is a 61 year old female, history of asthma since childhood, severe persistent atopic  asthma, positive Rast panel, elevated IgE, history of COVID-19 in the past.  She has been on several medications in the past for asthma control.  Currently on Breo plus Singulair plus antihistamines.  Now on Dupixent.  She has had a Dupixent site injection reaction  with the development of small hives.  This has been controlled with topical steroid and over-the-counter Benadryl.  Plan: Continue Breo 200 Refills for albuterol plus albuterol nebulizer Refills for Singulair and antihistamines. Continue Dupixent injections. Patient to monitor site for injection and any development of worsening allergic reaction. Patient has an EpiPen at home. If she feels that she needs to use her EpiPen she was instructed to go to the emergency department.  We discussed the risk today of biologic injection continuation.   Current Outpatient Medications:  .  albuterol (PROVENTIL) (2.5 MG/3ML) 0.083% nebulizer solution, , Disp: , Rfl:  .  albuterol (VENTOLIN HFA) 108 (90 Base) MCG/ACT inhaler, Inhale 1 puff into the lungs as needed., Disp: , Rfl:  .  BREO ELLIPTA 200-25 MCG/INH AEPB, Inhale 1 puff into the lungs daily., Disp: , Rfl:  .  clotrimazole-betamethasone (LOTRISONE) cream, Apply 1 application topically as needed. , Disp: , Rfl:  .  desloratadine (CLARINEX) 5 MG tablet, Take 5 mg by mouth daily., Disp: , Rfl:  .  Dupilumab (DUPIXENT) 300 MG/2ML SOPN, Inject 300 mg into the skin every 14 (fourteen) days. Copay card: Johnney KillianBI- [id number], PCN- LOYALTY, Group- [id number], ID- [id number], Disp: 4 mL, Rfl: 5 .  EPINEPHrine 0.3 mg/0.3 mL IJ SOAJ injection, Inject into the muscle as directed., Disp: , Rfl:  .  estrogen, conjugated,-medroxyprogesterone (PREMPRO) 0.45-1.5 MG tablet, Take 1 tablet by mouth daily., Disp: 90 tablet, Rfl: 3 .  fluticasone (FLONASE) 50 MCG/ACT nasal spray, Place 2 sprays into both nostrils daily., Disp: 16 g, Rfl: 2 .  montelukast (SINGULAIR) 10 MG tablet, Take 1 tablet (10 mg total) by mouth at  bedtime., Disp: 30 tablet, Rfl: 11 .  olmesartan-hydrochlorothiazide (BENICAR HCT) 40-12.5 MG tablet, Take 1 tablet by mouth daily., Disp: , Rfl:  .  OVER THE COUNTER MEDICATION, [facility] colon health Takes 1 daily, Disp: , Rfl:  .  rosuvastatin (CRESTOR) 20 MG tablet, TAKE 1 TABLET ONCE DAILY., Disp: 90 tablet, Rfl: 3 .  Spacer/Aero-Holding Chambers (AEROCHAMBER MV) inhaler, Use as instructed, Disp: 1 each, Rfl: 0 .  omeprazole (PRILOSEC) 20 MG capsule, Take 20 mg by mouth 2 (two) times daily. (Patient not taking: Reported on 01/07/2021), Disp: , Rfl:      Josephine IgoBradley L Cree Kunert, DO [facility] 01/07/2021 9:14 AM

## 2021-01-07 NOTE — Patient Instructions (Signed)
Thank you for visiting Dr. Tonia Brooms at [facility]. Today we recommend the following:  Meds ordered this encounter  Medications  . albuterol (PROVENTIL) (2.5 MG/3ML) 0.083% nebulizer solution    Sig: Take 3 mLs (2.5 mg total) by nebulization every 6 (six) hours as needed for wheezing or shortness of breath.    Dispense:  75 mL    Refill:  11  . montelukast (SINGULAIR) 10 MG tablet    Sig: Take 1 tablet (10 mg total) by mouth at bedtime.    Dispense:  30 tablet    Refill:  11  . albuterol (VENTOLIN HFA) 108 (90 Base) MCG/ACT inhaler    Sig: Inhale 1 puff into the lungs as needed.    Dispense:  18 g    Refill:  6  . BREO ELLIPTA 200-25 MCG/INH AEPB    Sig: Inhale 1 puff into the lungs daily.    Dispense:  90 each    Refill:  3  . desloratadine (CLARINEX) 5 MG tablet    Sig: Take 1 tablet (5 mg total) by mouth daily.    Dispense:  1 tablet    Refill:  11   Return in about 1 year (around 01/07/2022), or if symptoms worsen or fail to improve.    Please do your part to reduce the spread of COVID-19.

## 2021-01-07 NOTE — Telephone Encounter (Signed)
Received request to follow up with patient for injection site reaction.   Please see notes below.

## 2021-01-08 NOTE — Telephone Encounter (Signed)
Patient returned call and stated she will take Dr. Myrlene Broker advise from OV and continue Dupixent and manage reactions PRN. Nothing further needed.  Chesley Mires, PharmD, MPH Clinical Pharmacist (Rheumatology and Pulmonology)

## 2021-01-08 NOTE — Telephone Encounter (Signed)
ATC patient to discuss, unable to reach - left VM with patient regarding Dupixent injection site reaction. Based on OV visit notes from yesterday with Dr. Tonia Brooms, patient had hives that developed near injection but improved with cortisone and diphenhydramine.

## 2021-03-25 ENCOUNTER — Other Ambulatory Visit: Payer: Self-pay | Admitting: Internal Medicine

## 2021-03-25 DIAGNOSIS — Z1231 Encounter for screening mammogram for malignant neoplasm of breast: Secondary | ICD-10-CM

## 2021-04-16 ENCOUNTER — Other Ambulatory Visit: Payer: Self-pay | Admitting: Pharmacist

## 2021-04-16 DIAGNOSIS — Z79899 Other long term (current) drug therapy: Secondary | ICD-10-CM

## 2021-04-16 DIAGNOSIS — J4550 Severe persistent asthma, uncomplicated: Secondary | ICD-10-CM

## 2021-04-16 DIAGNOSIS — J8283 Eosinophilic asthma: Secondary | ICD-10-CM

## 2021-04-16 MED ORDER — DUPIXENT 300 MG/2ML ~~LOC~~ SOAJ: 300.0000 mg | SUBCUTANEOUS | 1 refills | Status: DC

## 2021-04-16 NOTE — Telephone Encounter (Signed)
Refill for Dupixent 300mg  every 14 days sent to [facility]  Last filled 04/08/21. Patient has remained adherent to fills per [facility] rep.  04/10/21, PharmD, MPH Clinical Pharmacist (Rheumatology and Pulmonology)

## 2021-04-20 ENCOUNTER — Telehealth: Payer: Self-pay

## 2021-04-20 NOTE — Telephone Encounter (Signed)
Submitted a Prior Authorization request to [facility] for DUPIXENT via Fax. Will update once we receive a response.   [id number]

## 2021-04-21 NOTE — Telephone Encounter (Signed)
Received notification from [facility] regarding a prior authorization for DUPIXENT. Authorization has been APPROVED from 04/21/2021 to 04/21/2022.   Patient must fill through [facility]: [phone]  Authorization # [id number]

## 2021-04-27 IMAGING — CT CT ANGIO HEAD
2 of 11 series · 3 of 33 positions shown · IV contrast (iopamidol)
Comparison: None.

CLINICAL DATA: Left-sided facial numbness. History of 5DQ90-FG
infection.

Creatinine was obtained on site at [HOSPITAL] at [REDACTED].
Results: Creatinine 0.8 mg/dL.
EXAM:
CT ANGIOGRAPHY HEAD AND NECK
TECHNIQUE: Multidetector CT imaging of the head and neck was performed using
the standard protocol during bolus administration of intravenous
contrast. Multiplanar CT image reconstructions and MIPs were
obtained to evaluate the vascular anatomy. Carotid stenosis
measurements (when applicable) are obtained utilizing NASCET
criteria, using the distal internal carotid diameter as the
denominator.
CONTRAST:  75mL YCODGX-1VS IOPAMIDOL (YCODGX-1VS) INJECTION 76%

[Series 11: brain 3.00 hr40 s3 sag without ibhc · sagittal · non-contrast · 0.32mm/px · 1 of 57 slices shown]
[im 29/57  soft-tissue]
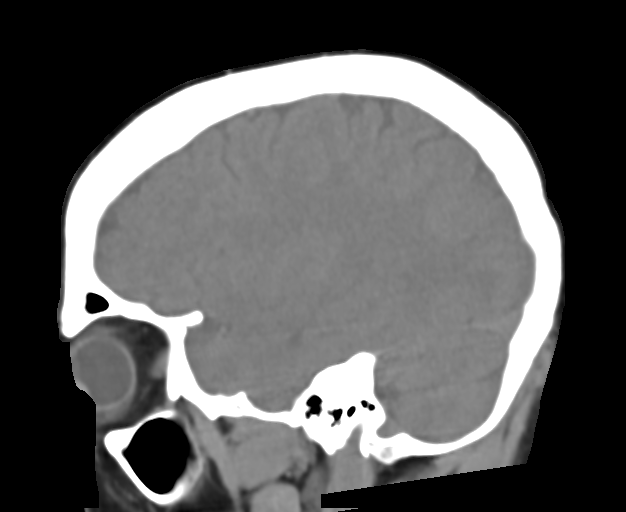

[Series 14: cta head & neck 1.00 hv48 s3 ax thin mips · axial · 0.43mm/px · z∈[-744,-612]mm · 2 of 397 slices shown]
[im 133/397  soft-tissue]
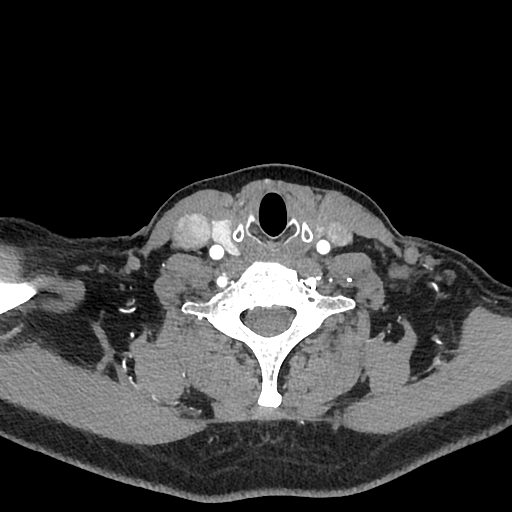
[im 265/397  bone]
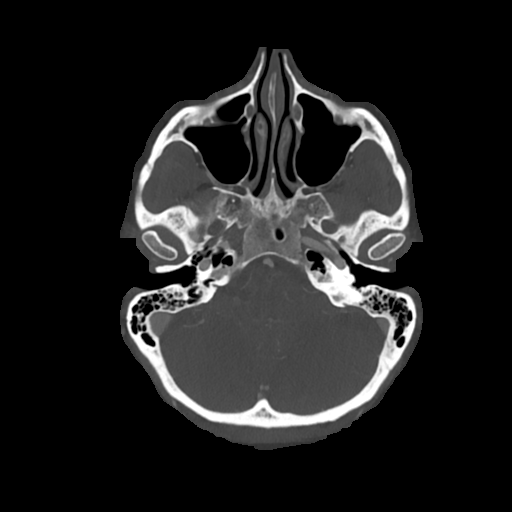

[3 of 33 positions shown; findings below may reference images not displayed]

FINDINGS: CT HEAD FINDINGS

Brain: There is no mass, hemorrhage or extra-axial collection. The
size and configuration of the ventricles and extra-axial CSF spaces
are normal. There is no acute or chronic infarction. The brain
parenchyma is normal.

Skull: The visualized skull base, calvarium and extracranial soft
tissues are normal.

Sinuses/Orbits: No fluid levels or advanced mucosal thickening of
the visualized paranasal sinuses. No mastoid or middle ear effusion.
The orbits are normal.

CTA NECK FINDINGS

SKELETON: There is no bony spinal canal stenosis. No lytic or
blastic lesion.

OTHER NECK: Normal pharynx, larynx and major salivary glands. No
cervical lymphadenopathy. Unremarkable thyroid gland.

UPPER CHEST: No pneumothorax or pleural effusion. No nodules or
masses.

AORTIC ARCH:

There is no calcific atherosclerosis of the aortic arch. There is no
aneurysm, dissection or hemodynamically significant stenosis of the
visualized portion of the aorta. Conventional 3 vessel aortic
branching pattern. The visualized proximal subclavian arteries are
widely patent.

RIGHT CAROTID SYSTEM: Normal without aneurysm, dissection or
stenosis.

LEFT CAROTID SYSTEM: Normal without aneurysm, dissection or
stenosis.

VERTEBRAL ARTERIES: Codominant configuration. Both origins are
clearly patent. There is no dissection, occlusion or flow-limiting
stenosis to the skull base (V1-V3 segments).

CTA HEAD FINDINGS

POSTERIOR CIRCULATION:

--Vertebral arteries: Normal V4 segments.

--Posterior inferior cerebellar arteries (PICA): Patent origins from
the vertebral arteries.

--Anterior inferior cerebellar arteries (AICA): Patent origins from
the basilar artery.

--Basilar artery: Normal.

--Superior cerebellar arteries: Normal.

--Posterior cerebral arteries: Normal. The left PCA is partially
supplied by a posterior communicating artery (p-comm).

ANTERIOR CIRCULATION:

--Intracranial internal carotid arteries: Normal.

--Anterior cerebral arteries (ACA): Normal. Both A1 segments are
present. Patent anterior communicating artery (a-comm).

--Middle cerebral arteries (MCA): Normal.

VENOUS SINUSES: As permitted by contrast timing, patent.

ANATOMIC VARIANTS: None

Review of the MIP images confirms the above findings.
IMPRESSION: Normal CTA of the head and neck.

## 2021-04-27 IMAGING — CT CT ANGIO NECK
2 of 11 series · 3 of 33 positions shown · IV contrast (iopamidol)
Comparison: None.

CLINICAL DATA: Left-sided facial numbness. History of 5DQ90-FG
infection.

Creatinine was obtained on site at [HOSPITAL] at [REDACTED].
Results: Creatinine 0.8 mg/dL.
EXAM:
CT ANGIOGRAPHY HEAD AND NECK
TECHNIQUE: Multidetector CT imaging of the head and neck was performed using
the standard protocol during bolus administration of intravenous
contrast. Multiplanar CT image reconstructions and MIPs were
obtained to evaluate the vascular anatomy. Carotid stenosis
measurements (when applicable) are obtained utilizing NASCET
criteria, using the distal internal carotid diameter as the
denominator.
CONTRAST:  75mL YCODGX-1VS IOPAMIDOL (YCODGX-1VS) INJECTION 76%

[Series 11: brain 3.00 hr40 s3 sag without ibhc · sagittal · non-contrast · 0.32mm/px · 1 of 57 slices shown]
[im 29/57  soft-tissue]
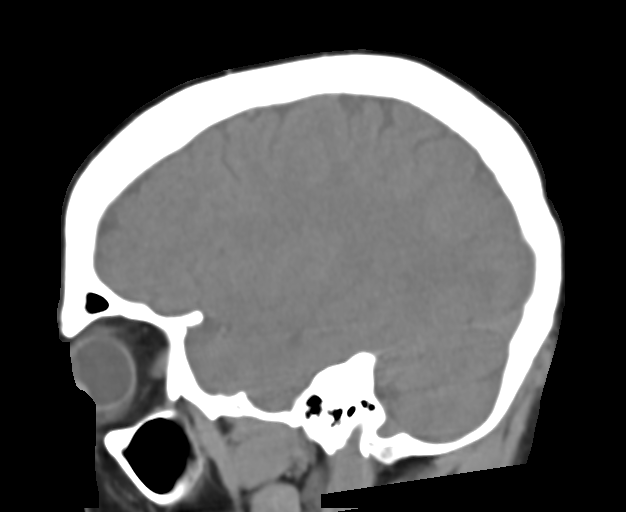

[Series 14: cta head & neck 1.00 hv48 s3 ax thin mips · axial · 0.43mm/px · z∈[-744,-612]mm · 2 of 397 slices shown]
[im 133/397  soft-tissue]
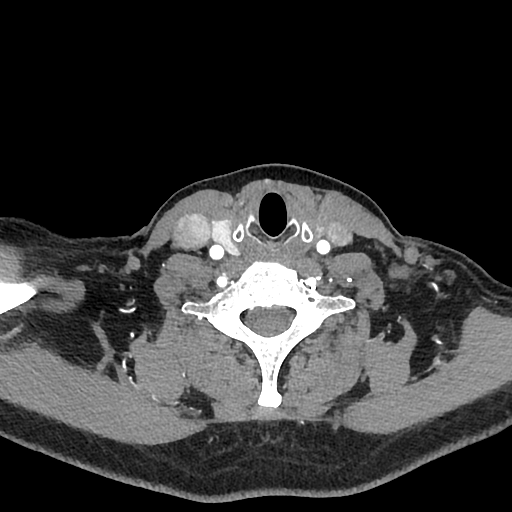
[im 265/397  bone]
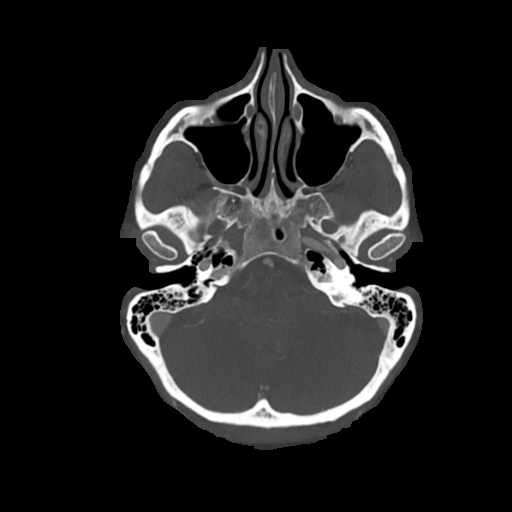

[3 of 33 positions shown; findings below may reference images not displayed]

FINDINGS: CT HEAD FINDINGS

Brain: There is no mass, hemorrhage or extra-axial collection. The
size and configuration of the ventricles and extra-axial CSF spaces
are normal. There is no acute or chronic infarction. The brain
parenchyma is normal.

Skull: The visualized skull base, calvarium and extracranial soft
tissues are normal.

Sinuses/Orbits: No fluid levels or advanced mucosal thickening of
the visualized paranasal sinuses. No mastoid or middle ear effusion.
The orbits are normal.

CTA NECK FINDINGS

SKELETON: There is no bony spinal canal stenosis. No lytic or
blastic lesion.

OTHER NECK: Normal pharynx, larynx and major salivary glands. No
cervical lymphadenopathy. Unremarkable thyroid gland.

UPPER CHEST: No pneumothorax or pleural effusion. No nodules or
masses.

AORTIC ARCH:

There is no calcific atherosclerosis of the aortic arch. There is no
aneurysm, dissection or hemodynamically significant stenosis of the
visualized portion of the aorta. Conventional 3 vessel aortic
branching pattern. The visualized proximal subclavian arteries are
widely patent.

RIGHT CAROTID SYSTEM: Normal without aneurysm, dissection or
stenosis.

LEFT CAROTID SYSTEM: Normal without aneurysm, dissection or
stenosis.

VERTEBRAL ARTERIES: Codominant configuration. Both origins are
clearly patent. There is no dissection, occlusion or flow-limiting
stenosis to the skull base (V1-V3 segments).

CTA HEAD FINDINGS

POSTERIOR CIRCULATION:

--Vertebral arteries: Normal V4 segments.

--Posterior inferior cerebellar arteries (PICA): Patent origins from
the vertebral arteries.

--Anterior inferior cerebellar arteries (AICA): Patent origins from
the basilar artery.

--Basilar artery: Normal.

--Superior cerebellar arteries: Normal.

--Posterior cerebral arteries: Normal. The left PCA is partially
supplied by a posterior communicating artery (p-comm).

ANTERIOR CIRCULATION:

--Intracranial internal carotid arteries: Normal.

--Anterior cerebral arteries (ACA): Normal. Both A1 segments are
present. Patent anterior communicating artery (a-comm).

--Middle cerebral arteries (MCA): Normal.

VENOUS SINUSES: As permitted by contrast timing, patent.

ANATOMIC VARIANTS: None

Review of the MIP images confirms the above findings.
IMPRESSION: Normal CTA of the head and neck.

## 2021-05-19 ENCOUNTER — Ambulatory Visit
Admission: RE | Admit: 2021-05-19 | Discharge: 2021-05-19 | Disposition: A | Discharge status: Home / Self Care | Payer: BC Managed Care – PPO | Source: Ambulatory Visit | Attending: Internal Medicine | Admitting: Internal Medicine

## 2021-05-19 ENCOUNTER — Other Ambulatory Visit: Payer: Self-pay

## 2021-05-19 DIAGNOSIS — Z1231 Encounter for screening mammogram for malignant neoplasm of breast: Secondary | ICD-10-CM

## 2021-06-30 ENCOUNTER — Other Ambulatory Visit: Payer: Self-pay

## 2021-06-30 ENCOUNTER — Encounter: Payer: Self-pay | Admitting: Obstetrics and Gynecology

## 2021-06-30 ENCOUNTER — Ambulatory Visit (INDEPENDENT_AMBULATORY_CARE_PROVIDER_SITE_OTHER): Payer: BC Managed Care – PPO | Admitting: Obstetrics and Gynecology

## 2021-06-30 VITALS — BP 122/74 | HR 79 | Ht 65.0 in | Wt 158.0 lb

## 2021-06-30 DIAGNOSIS — Z01419 Encounter for gynecological examination (general) (routine) without abnormal findings: Secondary | ICD-10-CM | POA: Diagnosis not present

## 2021-06-30 DIAGNOSIS — R300 Dysuria: Secondary | ICD-10-CM | POA: Diagnosis not present

## 2021-06-30 LAB — URINALYSIS, COMPLETE W/RFL CULTURE
Bacteria, UA: NONE SEEN /HPF
Bilirubin Urine: NEGATIVE
Glucose, UA: NEGATIVE
Hyaline Cast: NONE SEEN /LPF
Ketones, ur: NEGATIVE
Leukocyte Esterase: NEGATIVE
Nitrites, Initial: NEGATIVE
Protein, ur: NEGATIVE
RBC / HPF: NONE SEEN /HPF (ref 0–2)
Specific Gravity, Urine: 1.002 (ref 1.001–1.035)
WBC, UA: NONE SEEN /HPF (ref 0–5)
pH: 6.5 (ref 5.0–8.0)

## 2021-06-30 LAB — NO CULTURE INDICATED

## 2021-06-30 MED ORDER — PREMPRO 0.45-1.5 MG PO TABS: 1.0000 | ORAL_TABLET | Freq: Every day | ORAL | 3 refills | Status: DC

## 2021-06-30 NOTE — Progress Notes (Signed)
60 y.o. G2P2 Married Caucasian female here for annual exam.    Feels the best she has ever been.  On new medication for asthma.  Patient  complaining of vaginal dryness and having possible urinary issues. She thinks due to not using Prempro lately. She is noticing urinary frequency and tingling with urination.   She has using Prempro every other day.  She wants to continue. She did not tolerating reducing the dosage.   Working part time as Emergency planning/management officer.   PCP: [name], MD    No LMP recorded. Patient has had an ablation.           Sexually active: No.  The current method of family planning is none--Ablation.    Exercising: No.   Trying to walk 3x/week Smoker:  no  Health Maintenance: Pap:  06-25-20 Neg:Neg HR HPV, 10-29-14 Neg:Neg HR HPV--pt. Thought had pap 2017 History of abnormal Pap:  yes, years ago in 1990's unsure if had cryotherapy. MMG: 05-19-21 3D/Neg/BiRads1 Colonoscopy: 2015 normal BMD: 09-04-19 Result :Osteopenia of spine at PCP.  TDaP:  PCP Gardasil:   no HIV: no Hep C: no Screening Labs:  PCP. Covid booster:  just completed 2nd booster 05/29/21.  Flu vaccine:  she will do.  Shingrix:  not done.    reports that she has never smoked. She has never used smokeless tobacco. She reports that she does not currently use alcohol. She reports that she does not use drugs.  Past Medical History:  Diagnosis Date   Allergic rhinitis    Asthma    Hiatal hernia 06/2020   History of COVID-19 06/2019   Hyperlipidemia    Hypertension    Osteopenia     Past Surgical History:  Procedure Laterality Date   CESAREAN SECTION  2000   ENDOMETRIAL ABLATION     NASAL SINUS SURGERY  1989    Current Outpatient Medications  Medication Sig Dispense Refill   albuterol (PROVENTIL) (2.5 MG/3ML) 0.083% nebulizer solution Take 3 mLs (2.5 mg total) by nebulization every 6 (six) hours as needed for wheezing or shortness of breath. 75 mL 11   albuterol (VENTOLIN HFA) 108 (90 Base)  MCG/ACT inhaler Inhale 1 puff into the lungs as needed. 18 g 6   BREO ELLIPTA 200-25 MCG/INH AEPB Inhale 1 puff into the lungs daily.     clotrimazole-betamethasone (LOTRISONE) cream Apply 1 application topically as needed.      desloratadine (CLARINEX) 5 MG tablet Take 1 tablet (5 mg total) by mouth daily. 1 tablet 11   Dupilumab (DUPIXENT) 300 MG/2ML SOPN Inject 300 mg into the skin every 14 (fourteen) days. Copay card: Johnney Killian- [id number], PCN- [place], Group- [id number], ID- [id number] 12 mL 1   EPINEPHrine 0.3 mg/0.3 mL IJ SOAJ injection Inject into the muscle as directed.     estrogen, conjugated,-medroxyprogesterone (PREMPRO) 0.45-1.5 MG tablet Take 1 tablet by mouth daily. 90 tablet 3   fluticasone (FLONASE) 50 MCG/ACT nasal spray Place 2 sprays into both nostrils daily. 16 g 2   fluticasone furoate-vilanterol (BREO ELLIPTA) 200-25 MCG/INH AEPB 1 puff     montelukast (SINGULAIR) 10 MG tablet Take 1 tablet (10 mg total) by mouth at bedtime. 30 tablet 11   olmesartan-hydrochlorothiazide (BENICAR HCT) 40-12.5 MG tablet Take 1 tablet by mouth daily.     OVER THE COUNTER MEDICATION Vear Clock colon health Takes 1 daily     Probiotic Product (PROBIOTIC COLON SUPPORT PO) Take 1 tablet by mouth at bedtime.     rosuvastatin (CRESTOR) 20  MG tablet TAKE 1 TABLET ONCE DAILY. 90 tablet 3   Spacer/Aero-Holding Chambers (AEROCHAMBER MV) inhaler Use as instructed 1 each 0   No current facility-administered medications for this visit.    Family History  Problem Relation Age of Onset   Asthma Mother    Allergies Mother    Hypertension Mother    Thyroid disease Mother    COPD Maternal Grandmother    Hypertension Maternal Grandmother    Allergic rhinitis Brother    Dementia Brother    Allergic rhinitis Brother    Breast cancer Neg Hx     Review of Systems  All other systems reviewed and are negative.  Exam:   BP 122/74   Pulse 79   Ht 5\' 5"  (1.651 m)   Wt 158 lb (71.7 kg)   SpO2 98%   BMI 26.29  kg/m     General appearance: alert, cooperative and appears stated age Head: normocephalic, without obvious abnormality, atraumatic Neck: no adenopathy, supple, symmetrical, trachea midline and thyroid normal to inspection and palpation Lungs: clear to auscultation bilaterally Breasts: normal appearance, no masses or tenderness, No nipple retraction or dimpling, No nipple discharge or bleeding, No axillary adenopathy Heart: regular rate and rhythm Abdomen: soft, non-tender; no masses, no organomegaly Extremities: extremities normal, atraumatic, no cyanosis or edema Skin: skin color, texture, turgor normal. No rashes or lesions Lymph nodes: cervical, supraclavicular, and axillary nodes normal. Neurologic: grossly normal  Pelvic: External genitalia:  no lesions              No abnormal inguinal nodes palpated.              Urethra:  normal appearing urethra with no masses, tenderness or lesions              Bartholins and Skenes: normal                 Vagina: normal appearing vagina with normal color and discharge, no lesions              Cervix: no lesions              Pap taken: no Bimanual Exam:  Uterus:  normal size, contour, position, consistency, mobility, non-tender              Adnexa: no mass, fullness, tenderness              Rectal exam: yes.  Confirms.              Anus:  normal sphincter tone, no lesions  Chaperone was present for exam:  , CMA  Assessment:   Well woman visit with gynecologic exam. Status post endometrial ablation.  HRT.  Osteopenia.  PCP following.  HTN and elevated cholesterol. Asthma.  Dysuria.   Plan: Mammogram screening discussed. Self breast awareness reviewed. Pap and HR HPV as above. Guidelines for Calcium, Vitamin D, regular exercise program including cardiovascular and weight bearing exercise. Refill of Prempro 0.45/1.5 mg for one year.  Discused WHI and use of HRT which can increase risk of PE, DVT, MI, stroke and breast cancer.   We discussed Covid booster, flu vaccine, and Shingrix. Urinalysis:  sg <= 1.005, pH 6.5, trace lysed blood, NS WBC, NS RBC, NS bacteria, NS crystals.  Follow up annually and prn.    After visit summary provided.

## 2021-06-30 NOTE — Patient Instructions (Signed)

## 2021-07-07 ENCOUNTER — Telehealth: Payer: Self-pay | Admitting: Pulmonary Disease

## 2021-07-07 MED ORDER — ADVAIR HFA 115-21 MCG/ACT IN AERO: 2.0000 | INHALATION_SPRAY | Freq: Two times a day (BID) | RESPIRATORY_TRACT | 12 refills | Status: DC

## 2021-07-07 NOTE — Telephone Encounter (Signed)
Called and spoke with pt who stated she went to the pharmacy to get a refill of her Breo inhaler and when she got there, she was told that Virgel Bouquet was now requiring a PA.  Pt said that she called insurance about this and insurance told her that Earlie Server is no longer a covered inhaler.  Insurance will now cover Advair, fluticasone, and Wixela.  Dr. Tonia Brooms, please advise on this.

## 2021-07-07 NOTE — Telephone Encounter (Signed)
Icard, Bradley L, DO  You 1 hour ago (12:51 PM)   Can we do advair HFA 115, BID  Thanks   BLI    Called and spoke with pt letting her know the info stated by Dr. Tonia Brooms and she verbalized understanding. Rx for Advair has been sent to pharmacy for pt.nothing further needed.

## 2021-07-08 ENCOUNTER — Telehealth: Payer: Self-pay | Admitting: Pulmonary Disease

## 2021-07-08 NOTE — Telephone Encounter (Signed)
Pt came in stating that the wrong medication was called in for her per her last phone encounter. She dropped off papwerwork with plenty of documentation explaining what she needed. It's in BI box. She also brought paperwork he needs to fill out for a research program for her. Also in BI box

## 2021-07-08 NOTE — Telephone Encounter (Signed)
Called patient but she did not answer. Left message for her to call back.  

## 2021-07-08 NOTE — Telephone Encounter (Signed)
Ok so needs [employer] -can send in Advair 250 1 puff Twice daily  #1 ,   5 refill

## 2021-07-08 NOTE — Telephone Encounter (Signed)
I called and spoke with patient regarding message. Patient went to get Advair and insurance will cover Advair Diskus so is needing that sent into pharmacy, pharmacy stated it needs to be equal to Breo 200. Patient also left form that needs a provider to sign. They are doing a study on alzheimer's at [facility] and runs in her family and needs provider to join. Since Dr. Tonia Brooms is not in office until 07/19/21 and [name] has seen her, patient asking [name] to sign.  Will route to Dr. Tonia Brooms about medication to send in and [name] on signing form. Thanks guys!

## 2021-07-09 MED ORDER — FLUTICASONE-SALMETEROL 250-50 MCG/ACT IN AEPB: 1.0000 | INHALATION_SPRAY | Freq: Two times a day (BID) | RESPIRATORY_TRACT | 5 refills | Status: DC

## 2021-07-09 NOTE — Telephone Encounter (Signed)
Rx for Advair 250 has been sent to preferred pharmacy . Patient is aware and voiced her understanding.  Nothing further needed a this time.

## 2021-07-12 ENCOUNTER — Ambulatory Visit: Payer: BC Managed Care – PPO | Admitting: Adult Health

## 2021-07-12 ENCOUNTER — Other Ambulatory Visit: Payer: Self-pay

## 2021-07-12 ENCOUNTER — Encounter: Payer: Self-pay | Admitting: Adult Health

## 2021-07-12 ENCOUNTER — Telehealth: Payer: Self-pay | Admitting: Pulmonary Disease

## 2021-07-12 DIAGNOSIS — J4550 Severe persistent asthma, uncomplicated: Secondary | ICD-10-CM | POA: Diagnosis not present

## 2021-07-12 DIAGNOSIS — J302 Other seasonal allergic rhinitis: Secondary | ICD-10-CM

## 2021-07-12 NOTE — Progress Notes (Signed)
@Patient  ID: , female    DOB: [birth year], 60 y.o.   MRN: [id number]  Chief Complaint  Patient presents with   Follow-up    Referring provider: [id number], MD  HPI: 60 year old female never smoker seen for pulmonary consult January 01, 2020 for asthma and allergic rhinitis Patient is followed by allergist on daily allergy shots. Followed by ENT for chronic sinus disease. 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   TEST/EVENTS :  PFTs 02/2020 -normal lung function and DLCO  CXR 02/12/2020 negative for acute process RAST + ragweed,  Grass, trees , dogs++/ cat  , dust  IgE 91  Has 2 dogs    Sinus CT June 02, 2020 - NEG  07/12/2021 Follow up : Asthma and allergies Patient returns for a 53-month follow-up.  Patient has underlying allergic asthma and allergic rhinitis. She says overall her breathing has been doing very well since starting Dupixent earlier this year.  She is maintained on BREO and Dupixent injections. She complains that her insurance stopped covering BREO and had to changed to Advair 3 days ago, since starting advair it is causing her to feel nervous and heart rate to increase. She complains that she can not tolerate this , she is upset she has been doing so well until she had to stop BREO .  No fever, chest pain or edema.      No Known Allergies  Immunization History  Administered Date(s) Administered   Influenza Inj Mdck Quad With Preservative 07/10/2017   Influenza, High Dose Seasonal PF 10/12/2018, 08/30/2019   Influenza,inj,Quad PF,6+ Mos 07/11/2019   Influenza-Unspecified 07/10/2016   PFIZER(Purple Top)SARS-COV-2 Vaccination 12/24/2019, 01/15/2020, 08/14/2020, 07/06/2021    Past Medical History:  Diagnosis Date   Allergic rhinitis    Asthma    Hiatal hernia 06/2020   History of COVID-19 06/2019   Hyperlipidemia    Hypertension    Osteopenia     Tobacco History: Social History   Tobacco Use  Smoking  Status Never  Smokeless Tobacco Never   Counseling given: Not Answered   Outpatient Medications Prior to Visit  Medication Sig Dispense Refill   albuterol (PROVENTIL) (2.5 MG/3ML) 0.083% nebulizer solution Take 3 mLs (2.5 mg total) by nebulization every 6 (six) hours as needed for wheezing or shortness of breath. 75 mL 11   albuterol (VENTOLIN HFA) 108 (90 Base) MCG/ACT inhaler Inhale 1 puff into the lungs as needed. 18 g 6   clotrimazole-betamethasone (LOTRISONE) cream Apply 1 application topically as needed.      desloratadine (CLARINEX) 5 MG tablet Take 1 tablet (5 mg total) by mouth daily. 1 tablet 11   Dupilumab (DUPIXENT) 300 MG/2ML SOPN Inject 300 mg into the skin every 14 (fourteen) days. Copay card: 10/2020Johnney Killian, PCN- [employer], Group- - [id number], ID- [id number] 12 mL 1   EPINEPHrine 0.3 mg/0.3 mL IJ SOAJ injection Inject into the muscle as directed.     estrogen, conjugated,-medroxyprogesterone (PREMPRO) 0.45-1.5 MG tablet Take 1 tablet by mouth daily. 90 tablet 3   fluticasone (FLONASE) 50 MCG/ACT nasal spray Place 2 sprays into both nostrils daily. 16 g 2   fluticasone-salmeterol (ADVAIR DISKUS) 250-50 MCG/ACT AEPB Inhale 1 puff into the lungs in the morning and at bedtime. 1 each 5   fluticasone-salmeterol (ADVAIR HFA) 115-21 MCG/ACT inhaler Inhale 2 puffs into the lungs 2 (two) times daily. 8 g 12   montelukast (SINGULAIR) 10 MG tablet Take 1 tablet (10 mg total) by mouth at  bedtime. 30 tablet 11   olmesartan-hydrochlorothiazide (BENICAR HCT) 40-12.5 MG tablet Take 1 tablet by mouth daily.     OVER THE COUNTER MEDICATION Vear Clock colon health Takes 1 daily     Probiotic Product (PROBIOTIC COLON SUPPORT PO) Take 1 tablet by mouth at bedtime.     rosuvastatin (CRESTOR) 20 MG tablet TAKE 1 TABLET ONCE DAILY. 90 tablet 3   Spacer/Aero-Holding Chambers (AEROCHAMBER MV) inhaler Use as instructed 1 each 0   No facility-administered medications prior to visit.     Review of Systems:    Constitutional:   No  weight loss, night sweats,  Fevers, chills, fatigue, or  lassitude.  HEENT:   No headaches,  Difficulty swallowing,  Tooth/dental problems, or  Sore throat,                No sneezing, itching, ear ache, nasal congestion, post nasal drip,   CV:  No chest pain,  Orthopnea, PND, swelling in lower extremities, anasarca, dizziness, syncope.   GI  No heartburn, indigestion, abdominal pain, nausea, vomiting, diarrhea, change in bowel habits, loss of appetite, bloody stools.   Resp: .  No chest wall deformity  Skin: no rash or lesions.  GU: no dysuria, change in color of urine, no urgency or frequency.  No flank pain, no hematuria   MS:  No joint pain or swelling.  No decreased range of motion.  No back pain.    Physical Exam  BP 122/62 (BP Location: Left Arm, Patient Position: Sitting, Cuff Size: Normal)   Pulse 85   Temp 98.6 F (37 C) (Oral)   Ht 5\' 5"  (1.651 m)   Wt 157 lb 9.6 oz (71.5 kg)   SpO2 97%   BMI 26.23 kg/m   GEN: A/Ox3; pleasant , NAD, well nourished    HEENT:  [place]/AT,   NOSE-clear, THROAT-clear, no lesions, no postnasal drip or exudate noted.   NECK:  Supple w/ fair ROM; no JVD; normal carotid impulses w/o bruits; no thyromegaly or nodules palpated; no lymphadenopathy.    RESP  Clear  P & A; w/o, wheezes/ rales/ or rhonchi. no accessory muscle use, no dullness to percussion  CARD:  RRR, no m/r/g, no peripheral edema, pulses intact, no cyanosis or clubbing.  GI:   Soft & nt; nml bowel sounds; no organomegaly or masses detected.   Musco: Warm bil, no deformities or joint swelling noted.   Neuro: alert, no focal deficits noted.    Skin: Warm, no lesions or rashes    Lab Results:  CBC    Component Value Date/Time   WBC 11.5 (H) 01/01/2020 0958   RBC 5.39 (H) 01/01/2020 0958   HGB 15.6 (H) 01/01/2020 0958   HCT 47.1 (H) 01/01/2020 0958   PLT 263.0 01/01/2020 0958   MCV 87.4 01/01/2020 0958   MCHC 33.2 01/01/2020 0958   RDW  13.4 01/01/2020 0958   LYMPHSABS 3.3 01/01/2020 0958   MONOABS 0.8 01/01/2020 0958   EOSABS 0.2 01/01/2020 0958   BASOSABS 0.0 01/01/2020 0958    BMET No results found for: NA, K, CL, CO2, GLUCOSE, BUN, CREATININE, CALCIUM, GFRNONAA, GFRAA  BNP No results found for: BNP  ProBNP No results found for: PROBNP  Imaging: No results found.    PFT Results Latest Ref Rng & Units 02/12/2020  FVC-Pre L 3.37  FVC-Predicted Pre % 95  FVC-Post L 3.39  FVC-Predicted Post % 95  Pre FEV1/FVC % % 73  Post FEV1/FCV % % 74  FEV1-Pre  L 2.47  FEV1-Predicted Pre % 89  FEV1-Post L 2.51  DLCO uncorrected ml/min/mmHg 25.74  DLCO UNC% % 120  DLCO corrected ml/min/mmHg 24.25  DLCO COR %Predicted % 113  DLVA Predicted % 115  TLC L 5.51  TLC % Predicted % 104  RV % Predicted % 104    No results found for: NITRICOXIDE      Assessment & Plan:   Severe persistent asthma without complication Doing very well until BREO changed to Advair  Increased side effects with Advair  Will try to decrease dose and see if she can tolerate  If unable consider changing to QVAR only .  Previously no perceived benefit with Symbicort   Plan  Patient Instructions  Hold Advair today and tomorrow.  Restart Advair on Wednesday night , take 1 puff  At bedtime   Call back if unable to tolerate.  Albuterol inhaler or Neb As needed   Continue on Dupixent every 2 weeks Follow up with Dr. Tonia Brooms as planned and As needed   Please contact office for sooner follow up if symptoms do not improve or worsen or seek emergency care       Seasonal allergies Under control  No changes    I spent   30 minutes dedicated to the care of this patient on the date of this encounter to include pre-visit review of records, face-to-face time with the patient discussing conditions above, post visit ordering of testing, clinical documentation with the electronic health record, making appropriate referrals as documented, and  communicating necessary findings to members of the patients care team.    Rubye Oaks, NP 07/12/2021

## 2021-07-12 NOTE — Patient Instructions (Addendum)
Hold Advair today and tomorrow.  Restart Advair on Wednesday night , take 1 puff  At bedtime   Call back if unable to tolerate.  Albuterol inhaler or Neb As needed   Continue on Dupixent every 2 weeks Follow up with Dr. Tonia Brooms as planned and As needed   Please contact office for sooner follow up if symptoms do not improve or worsen or seek emergency care

## 2021-07-12 NOTE — Assessment & Plan Note (Signed)
Doing very well until BREO changed to Advair  Increased side effects with Advair  Will try to decrease dose and see if she can tolerate  If unable consider changing to QVAR only .  Previously no perceived benefit with Symbicort   Plan  Patient Instructions  Hold Advair today and tomorrow.  Restart Advair on Wednesday night , take 1 puff  At bedtime   Call back if unable to tolerate.  Albuterol inhaler or Neb As needed   Continue on Dupixent every 2 weeks Follow up with Dr. Tonia Brooms as planned and As needed   Please contact office for sooner follow up if symptoms do not improve or worsen or seek emergency care

## 2021-07-12 NOTE — Assessment & Plan Note (Signed)
Under control  No changes

## 2021-07-13 MED ORDER — BECLOMETHASONE DIPROP HFA 80 MCG/ACT IN AERB: 2.0000 | INHALATION_SPRAY | Freq: Two times a day (BID) | RESPIRATORY_TRACT | 6 refills | Status: DC

## 2021-07-13 NOTE — Telephone Encounter (Signed)
Called and spoke with patient, advised her to call her insurance company to see what inhalers are on the preferred list and then we can get that list to [name] to see what would benefit her most.  I also advised her that it would also let us know what preferred inhalers she has tried and failed and may possibly help [place] get a PA to cover the [facility].  She will get the list to [place] asap.  [name], Please see patient comment and provide any recommendations between now and when we can get the list of the preferred inhalers.  Thank you.

## 2021-07-13 NOTE — Telephone Encounter (Signed)
Wheezing today, had to take albuterol, helped but makes her nervous.  Wants alternative called in  Rec :  QVAR 80 2 puffs Twice daily   Rx sent to pharm.  Asthma action plan discussed   No fever, discolored mucus , chest pain .   Please contact office for sooner follow up if symptoms do not improve or worsen or seek emergency care

## 2021-07-15 NOTE — Telephone Encounter (Signed)
Opened in error.   Will close encounter.  

## 2021-07-19 ENCOUNTER — Other Ambulatory Visit: Payer: Self-pay

## 2021-07-19 ENCOUNTER — Telehealth: Payer: Self-pay

## 2021-07-19 NOTE — Telephone Encounter (Signed)
ATC patient to see what inhalers she is currently taking. There is paperwork that Dr. Tonia Brooms gave me that says we need to send in new prescription for Advair Diskus because insurance would not pay for Breo. It looks like Advair HFA was sent in and note says that it needs to not be the HFA only the Diskus. But also see a message that [name] Parrett sent in a RX for Qvar 80 last week.   Just need to get clarification from patient on which inhaler she is taking so we can make sure that her chart it up to date and so she has the correct inhalers.

## 2021-07-20 NOTE — Telephone Encounter (Signed)
Spoke with patient who states that she is currently on Breo 200. She states that Advair HFA was sent in but was not covered it has to be the Advair Diskus. She states that she tried the Advair Diskus and she felt like she was going to crawl out of her skin and was itching. I have added that to her allergy list. She also had Qvar sent in but never went and picked it up. Patient states that her Allergist Dr. Elberta Callas and his office did a PA on Breo 200 and got it approved and so she is now back on that. States that she has it at least for the next 6 months and is feeling much better. Advised patient if she needs anything else to please let us know. Nothing further needed at this time.

## 2021-09-14 ENCOUNTER — Other Ambulatory Visit: Payer: Self-pay | Admitting: Registered Nurse

## 2021-09-14 DIAGNOSIS — E7800 Pure hypercholesterolemia, unspecified: Secondary | ICD-10-CM

## 2021-09-29 ENCOUNTER — Other Ambulatory Visit: Payer: Self-pay | Admitting: Pharmacist

## 2021-09-29 DIAGNOSIS — J4550 Severe persistent asthma, uncomplicated: Secondary | ICD-10-CM

## 2021-09-29 DIAGNOSIS — J8283 Eosinophilic asthma: Secondary | ICD-10-CM

## 2021-09-29 MED ORDER — DUPIXENT 300 MG/2ML ~~LOC~~ SOAJ: 300.0000 mg | SUBCUTANEOUS | 0 refills | Status: DC

## 2021-09-29 NOTE — Telephone Encounter (Signed)
Refill sent for DUPIXENT to [facility]: [id number] or 3 month supply only  Dose: 300 mg SQ every 14 days   Last OV: 07/12/21 Rubye Oaks, NP) Provider: Dr. Tonia Brooms  Next OV: anticipated March/April 2023 but not yet scheduled  Chesley Mires, PharmD, MPH, BCPS Clinical Pharmacist (Rheumatology and Pulmonology)

## 2021-10-08 ENCOUNTER — Ambulatory Visit
Admission: RE | Admit: 2021-10-08 | Discharge: 2021-10-08 | Disposition: A | Discharge status: Home / Self Care | Payer: No Typology Code available for payment source | Source: Ambulatory Visit | Attending: Registered Nurse | Admitting: Registered Nurse

## 2021-10-08 DIAGNOSIS — E7800 Pure hypercholesterolemia, unspecified: Secondary | ICD-10-CM

## 2021-12-24 ENCOUNTER — Other Ambulatory Visit: Payer: Self-pay

## 2021-12-24 ENCOUNTER — Ambulatory Visit: Payer: No Typology Code available for payment source | Admitting: Pulmonary Disease

## 2021-12-24 ENCOUNTER — Ambulatory Visit: Payer: BC Managed Care – PPO | Admitting: Pulmonary Disease

## 2021-12-24 ENCOUNTER — Encounter: Payer: Self-pay | Admitting: Pulmonary Disease

## 2021-12-24 VITALS — BP 134/62 | HR 87 | Temp 98.2°F | Ht 65.0 in | Wt 156.2 lb

## 2021-12-24 DIAGNOSIS — J302 Other seasonal allergic rhinitis: Secondary | ICD-10-CM | POA: Diagnosis not present

## 2021-12-24 DIAGNOSIS — J8283 Eosinophilic asthma: Secondary | ICD-10-CM | POA: Diagnosis not present

## 2021-12-24 DIAGNOSIS — Z8616 Personal history of COVID-19: Secondary | ICD-10-CM

## 2021-12-24 DIAGNOSIS — R768 Other specified abnormal immunological findings in serum: Secondary | ICD-10-CM

## 2021-12-24 DIAGNOSIS — J4550 Severe persistent asthma, uncomplicated: Secondary | ICD-10-CM

## 2021-12-24 DIAGNOSIS — T7849XA Other allergy, initial encounter: Secondary | ICD-10-CM

## 2021-12-24 MED ORDER — FLUTICASONE FUROATE-VILANTEROL 200-25 MCG/ACT IN AEPB: 1.0000 | INHALATION_SPRAY | Freq: Every day | RESPIRATORY_TRACT | 3 refills | Status: AC

## 2021-12-24 MED ORDER — DESLORATADINE 5 MG PO TABS: 5.0000 mg | ORAL_TABLET | Freq: Every day | ORAL | 3 refills | Status: DC

## 2021-12-24 MED ORDER — ALBUTEROL SULFATE HFA 108 (90 BASE) MCG/ACT IN AERS: 2.0000 | INHALATION_SPRAY | Freq: Four times a day (QID) | RESPIRATORY_TRACT | 3 refills | Status: DC | PRN

## 2021-12-24 MED ORDER — MONTELUKAST SODIUM 10 MG PO TABS: 10.0000 mg | ORAL_TABLET | Freq: Every day | ORAL | 3 refills | Status: DC

## 2021-12-24 MED ORDER — FLUTICASONE PROPIONATE 50 MCG/ACT NA SUSP: 2.0000 | Freq: Every day | NASAL | 3 refills | Status: DC

## 2021-12-24 NOTE — Patient Instructions (Signed)
Thank you for visiting Dr. Tonia Brooms at [facility]. ?Today we recommend the following: ? ?Meds ordered this encounter  ?Medications  ? fluticasone furoate-vilanterol (BREO ELLIPTA) 200-25 MCG/ACT AEPB  ?  Sig: Inhale 1 puff into the lungs daily.  ?  Dispense:  90 each  ?  Refill:  3  ? montelukast (SINGULAIR) 10 MG tablet  ?  Sig: Take 1 tablet (10 mg total) by mouth at bedtime.  ?  Dispense:  90 tablet  ?  Refill:  3  ? desloratadine (CLARINEX) 5 MG tablet  ?  Sig: Take 1 tablet (5 mg total) by mouth daily.  ?  Dispense:  90 tablet  ?  Refill:  3  ? fluticasone (FLONASE) 50 MCG/ACT nasal spray  ?  Sig: Place 2 sprays into both nostrils daily.  ?  Dispense:  1 g  ?  Refill:  3  ? albuterol (VENTOLIN HFA) 108 (90 Base) MCG/ACT inhaler  ?  Sig: Inhale 2 puffs into the lungs every 6 (six) hours as needed for wheezing or shortness of breath.  ?  Dispense:  18 g  ?  Refill:  3  ? ?Return in about 1 year (around 12/25/2022), or if symptoms worsen or fail to improve, for w/ Dr. Tonia Brooms . ? ? ? ?Please do your part to reduce the spread of COVID-19.  ? ?

## 2021-12-24 NOTE — Progress Notes (Signed)
? ?Synopsis: Referred in March 2021 for asthma by Carol Brunette, MD ? ?Subjective:  ? ?PATIENT ID: Carol Acosta GENDER: female DOB: October 06, 1961, MRN: [id number] ? ?Chief Complaint  ?Patient presents with  ? Follow-up  ?  Follow up. Patient has no complaints.   ? ? ?This is a 61 year old female past medical history of asthma, hypertension hyperlipidemia, allergic rhinitis.  Patient presents to the office for evaluation of asthma. She has had asthma since child. She has had allergy skin testing with multiple positivities. She had September covid mild symptoms, dx sept 3rd. She always gets flare ups in aug, September, again with issues in the spring time. She has seen cardiology for management of HTN and HLD. She retired Nov 1st. Her anxiety is much better down. She still having recurrent flares. She is married, twin 83 years olds, and 60 year old. Currently managed with Breo 200, was on prednisone three times this past year.  ? ?OV 01/01/2020: Patient with ongoing respiratory symptoms.  Very anxious today.  She has been trying to work out and start trying to take care of herself.  We talked about various other medications that she has been using.  She uses antihistamines on occasion.  She has been on Singulair in the past but not currently.  Seldom uses her nebulizer or albuterol.  Even though she does state that she probably should when she has symptoms but she does not always reach to use her albuterol.   ? ?OV 10/08/2020: Here today for follow-up history of asthma. Patient last seen in the office September 2021 by Rikki Spearing, NP patient was put on a prednisone taper recommend to continue use of Breo.  Today doing well today.  Had a slight flare over the Christmas holiday of able to maintain with just use of as needed albuterol she is religiously using her antihistamines and Singulair.  We reviewed her previous lab work approximately 9 months ago which included a Product manager and CBC which had a absolute  eosinophil count of 200 and IgE of 91 and positive environmental allergens. ? ?OV 12/24/2021: Here today for follow-up.  Had some issues regarding obtaining Breo and insurance wanting to change medications.  However after starting Dupixent this is changed her life.  She has only had 1 exacerbation this past year.  In 1 appointment for a office visit regarding upper respiratory tract symptoms which she normally had multiple per year.  She is back on Breo 200 and doing well with this.  She needs refills of all of her allergy medications, inhalers and albuterol. ? ? ?Past Medical History:  ?Diagnosis Date  ? Allergic rhinitis   ? Asthma   ? Hiatal hernia 06/2020  ? History of COVID-19 06/2019  ? Hyperlipidemia   ? Hypertension   ? Osteopenia   ?  ? ?Family History  ?Problem Relation Age of Onset  ? Asthma Mother   ? Allergies Mother   ? Hypertension Mother   ? Thyroid disease Mother   ? COPD Maternal Grandmother   ? Hypertension Maternal Grandmother   ? Allergic rhinitis Brother   ? Dementia Brother   ? Allergic rhinitis Brother   ? Breast cancer Neg Hx   ?  ? ?Past Surgical History:  ?Procedure Laterality Date  ? CESAREAN SECTION  2000  ? ENDOMETRIAL ABLATION    ? NASAL SINUS SURGERY  1989  ? ? ?Social History  ? ?Socioeconomic History  ? Marital status: Married  ?  Spouse name:  Not on file  ? Number of children: 3  ? Years of education: Not on file  ? Highest education level: Not on file  ?Occupational History  ? 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 Smoking status: Never  ? Smokeless tobacco: Never  ?Vaping Use  ? Vaping Use: Never used  ?Substance and Sexual Activity  ? Alcohol use: Not Currently  ? Drug use: No  ? Sexual activity: Not Currently  ?  Birth control/protection: None  ?Other Topics Concern  ? Not on file  ?Social History Narrative  ? Not on file  ? ?Social Determinants of Health  ? ?Financial Resource Strain: Not on file  ?Food Insecurity: Not on file  ?Transportation Needs: Not on file   ?Physical Activity: Not on file  ?Stress: Not on file  ?Social Connections: Not on file  ?Intimate Partner Violence: Not on file  ?  ? ?Allergies  ?Allergen Reactions  ? Advair Diskus [Fluticasone-Salmeterol] Itching and Anxiety  ?  Patient states that she felt like she was going crawl out of her skin.  ?  ? ?Outpatient Medications Prior to Visit  ?Medication Sig Dispense Refill  ? albuterol (PROVENTIL) (2.5 MG/3ML) 0.083% nebulizer solution Take 3 mLs (2.5 mg total) by nebulization every 6 (six) hours as needed for wheezing or shortness of breath. 75 mL 11  ? albuterol (VENTOLIN HFA) 108 (90 Base) MCG/ACT inhaler Inhale 1 puff into the lungs as needed. 18 g 6  ? BREO ELLIPTA 200-25 MCG/INH AEPB Inhale 1 puff into the lungs daily.    ? clotrimazole-betamethasone (LOTRISONE) cream Apply 1 application topically as needed.     ? desloratadine (CLARINEX) 5 MG tablet Take 1 tablet (5 mg total) by mouth daily. 1 tablet 11  ? Dupilumab (DUPIXENT) 300 MG/2ML SOPN Inject 300 mg into the skin every 14 (fourteen) days. Copay card: Johnney Killian- [id number], PCN- [id number], Group- [id number], ID- [id number] 12 mL 0  ? EPINEPHrine 0.3 mg/0.3 mL IJ SOAJ injection Inject into the muscle as directed.    ? estrogen, conjugated,-medroxyprogesterone (PREMPRO) 0.45-1.5 MG tablet Take 1 tablet by mouth daily. (Patient taking differently: Take 1 tablet by mouth. Take every other day) 90 tablet 3  ? fluticasone (FLONASE) 50 MCG/ACT nasal spray Place 2 sprays into both nostrils daily. 16 g 2  ? montelukast (SINGULAIR) 10 MG tablet Take 1 tablet (10 mg total) by mouth at bedtime. 30 tablet 11  ? olmesartan-hydrochlorothiazide (BENICAR HCT) 40-12.5 MG tablet Take 1 tablet by mouth daily.    ? OVER THE COUNTER MEDICATION Vear Clock colon health ?Takes 1 daily    ? Probiotic Product (PROBIOTIC COLON SUPPORT PO) Take 1 tablet by mouth at bedtime.    ? rosuvastatin (CRESTOR) 20 MG tablet TAKE 1 TABLET ONCE DAILY. 90 tablet 3  ? Spacer/Aero-Holding Chambers  (AEROCHAMBER MV) inhaler Use as instructed 1 each 0  ? ?No facility-administered medications prior to visit.  ? ? ?Review of Systems  ?Constitutional:  Negative for chills, fever, malaise/fatigue and weight loss.  ?HENT:  Negative for hearing loss, sore throat and tinnitus.   ?Eyes:  Negative for blurred vision and double vision.  ?Respiratory:  Positive for cough, sputum production and shortness of breath. Negative for hemoptysis, wheezing and stridor.   ?Cardiovascular:  Negative for chest pain, palpitations, orthopnea, leg swelling and PND.  ?Gastrointestinal:  Negative for abdominal pain, constipation, diarrhea, heartburn, nausea and vomiting.  ?Genitourinary:  Negative for dysuria, hematuria and urgency.  ?Musculoskeletal:  Negative for joint pain  and myalgias.  ?Skin:  Negative for itching and rash.  ?Neurological:  Negative for dizziness, tingling, weakness and headaches.  ?Endo/Heme/Allergies:  Negative for environmental allergies. Does not bruise/bleed easily.  ?Psychiatric/Behavioral:  Negative for depression. The patient is not nervous/anxious and does not have insomnia.   ?All other systems reviewed and are negative. ? ? ?Objective:  ?Physical Exam ?Vitals reviewed.  ?Constitutional:   ?   General: She is not in acute distress. ?   Appearance: She is well-developed.  ?HENT:  ?   Head: Normocephalic and atraumatic.  ?Eyes:  ?   General: No scleral icterus. ?   Conjunctiva/sclera: Conjunctivae normal.  ?   Pupils: Pupils are equal, round, and reactive to light.  ?Neck:  ?   Vascular: No JVD.  ?   Trachea: No tracheal deviation.  ?Cardiovascular:  ?   Rate and Rhythm: Normal rate and regular rhythm.  ?   Heart sounds: Normal heart sounds. No murmur heard. ?Pulmonary:  ?   Effort: Pulmonary effort is normal. No tachypnea, accessory muscle usage or respiratory distress.  ?   Breath sounds: No stridor. No wheezing, rhonchi or rales.  ?Abdominal:  ?   General: There is no distension.  ?   Palpations: Abdomen is  soft.  ?   Tenderness: There is no abdominal tenderness.  ?Musculoskeletal:     ?   General: No tenderness.  ?   Cervical back: Neck supple.  ?Lymphadenopathy:  ?   Cervical: No cervical adenopathy.  ?Skin: ?   General: S

## 2021-12-27 ENCOUNTER — Other Ambulatory Visit: Payer: Self-pay | Admitting: Pharmacist

## 2021-12-27 DIAGNOSIS — J4550 Severe persistent asthma, uncomplicated: Secondary | ICD-10-CM

## 2021-12-27 DIAGNOSIS — J8283 Eosinophilic asthma: Secondary | ICD-10-CM

## 2021-12-27 MED ORDER — DUPIXENT 300 MG/2ML ~~LOC~~ SOAJ: 300.0000 mg | SUBCUTANEOUS | 1 refills | Status: DC

## 2022-02-07 ENCOUNTER — Telehealth: Payer: Self-pay | Admitting: Pulmonary Disease

## 2022-02-07 NOTE — Telephone Encounter (Signed)
Attempted to call pt but unable to reach. Left message for her to return call. 

## 2022-02-07 NOTE — Telephone Encounter (Signed)
Called and spoke with pt stating to her that we should get her scheduled for an appt to reevaluate and she verbalized understanding. While speaking with pt, she said that she is finishing up prednisone today 5/1 and states that she is not feeling any better. Pt is coughing up green phlegm and also still having problems with her breathing. Appt scheduled for pt with KC 5/2 at 9:30. Nothing further needed. ?

## 2022-02-08 ENCOUNTER — Encounter: Payer: Self-pay | Admitting: Nurse Practitioner

## 2022-02-08 ENCOUNTER — Ambulatory Visit (INDEPENDENT_AMBULATORY_CARE_PROVIDER_SITE_OTHER): Payer: BC Managed Care – PPO

## 2022-02-08 ENCOUNTER — Ambulatory Visit: Payer: BC Managed Care – PPO | Admitting: Nurse Practitioner

## 2022-02-08 VITALS — BP 122/64 | HR 78 | Temp 98.1°F | Ht 65.0 in | Wt 156.0 lb

## 2022-02-08 DIAGNOSIS — B9689 Other specified bacterial agents as the cause of diseases classified elsewhere: Secondary | ICD-10-CM

## 2022-02-08 DIAGNOSIS — J019 Acute sinusitis, unspecified: Secondary | ICD-10-CM | POA: Diagnosis not present

## 2022-02-08 DIAGNOSIS — J040 Acute laryngitis: Secondary | ICD-10-CM

## 2022-02-08 DIAGNOSIS — J4551 Severe persistent asthma with (acute) exacerbation: Secondary | ICD-10-CM | POA: Diagnosis not present

## 2022-02-08 DIAGNOSIS — J209 Acute bronchitis, unspecified: Secondary | ICD-10-CM

## 2022-02-08 MED ORDER — AMOXICILLIN-POT CLAVULANATE 875-125 MG PO TABS: 1.0000 | ORAL_TABLET | Freq: Two times a day (BID) | ORAL | 0 refills | Status: DC

## 2022-02-08 MED ORDER — PREDNISONE 10 MG PO TABS: ORAL_TABLET | ORAL | 0 refills | Status: DC

## 2022-02-08 MED ORDER — AZELASTINE HCL 0.1 % NA SOLN: 2.0000 | Freq: Two times a day (BID) | NASAL | 3 refills | Status: DC

## 2022-02-08 NOTE — Assessment & Plan Note (Addendum)
Due to inflammation from postnasal drainage and cough - advised cough and postnasal drainage control measures. Voice rest and supportive care. ?

## 2022-02-08 NOTE — Patient Instructions (Addendum)
Continue Breo 1 puff daily  ?Continue Albuterol inhaler 2 puffs or 3 mL neb every 6 hours as needed for shortness of breath or wheezing. Notify if symptoms persist despite rescue inhaler/neb use. ?Continue Clarinex 5 mg daily for allergies  ?Continue flonase 2 sprays each nostril daily  ?Continue singulair 10 mg daily  ?Continue saline nasal spray or irrigations 1-2 times a day  ? ?-Augmentin 1 tab Twice daily for 7 days. Take with food. Take daily probiotic while on antibiotic. ?-Mucinex 600 mg Twice daily for congestion  ?-Delsym 2 tsp Twice daily for cough ?-Astelin nasal spray 2 sprays each nostril Twice daily for nasal congestion/drainage until symptoms improve then as needed ?-Prednisone taper. 4 tabs for 2 days, then 3 tabs for 2 days, 2 tabs for 2 days, then 1 tab for 2 days, then stop. Take in AM with food.  ? ?Avoid throat clearing. Frequent sips of water. Suck on sugar free hard candies throughout the day. ? ?Labs today  ? ?Chest x ray today. We will notify you of any abnormal results  ? ?Follow up in 2 weeks with Dr. Tonia Brooms or Philis Nettle. If symptoms do not improve or worsen, please contact office for sooner follow up or seek emergency care. ?

## 2022-02-08 NOTE — Assessment & Plan Note (Signed)
Slow to resolve exacerbation. FeNO mildly elevated. Will extend current prednisone with taper and add augmentin course. Concern for correlation between dupixent injections and timing of URIs/bronchitis - check CBC with diff to r/o eosinophilia. If this is nl, will need to closely monitor symptoms after inj moving forward and may need to consider another biologic agent. Continue Breo inhaler. ? ?Patient Instructions  ?Continue Breo 1 puff daily  ?Continue Albuterol inhaler 2 puffs or 3 mL neb every 6 hours as needed for shortness of breath or wheezing. Notify if symptoms persist despite rescue inhaler/neb use. ?Continue Clarinex 5 mg daily for allergies  ?Continue flonase 2 sprays each nostril daily  ?Continue singulair 10 mg daily  ?Continue saline nasal spray or irrigations 1-2 times a day  ? ?-Augmentin 1 tab Twice daily for 7 days. Take with food. Take daily probiotic while on antibiotic. ?-Mucinex 600 mg Twice daily for congestion  ?-Delsym 2 tsp Twice daily for cough ?-Astelin nasal spray 2 sprays each nostril Twice daily for nasal congestion/drainage until symptoms improve then as needed ?-Prednisone taper. 4 tabs for 2 days, then 3 tabs for 2 days, 2 tabs for 2 days, then 1 tab for 2 days, then stop. Take in AM with food.  ? ?Avoid throat clearing. Frequent sips of water. Suck on sugar free hard candies throughout the day. ? ?Labs today  ? ?Chest x ray today. We will notify you of any abnormal results  ? ?Follow up in 2 weeks with Dr. Tonia Brooms or Philis Nettle. If symptoms do not improve or worsen, please contact office for sooner follow up or seek emergency care. ? ? ?

## 2022-02-08 NOTE — Progress Notes (Signed)
Please notify pt CXR clear without evidence of superimposed infection. Continue with our plan as discussed at her visit. Thanks!

## 2022-02-08 NOTE — Progress Notes (Signed)
? ?@Patient  ID: Carol Acosta, female    DOB: 07-19-1961, 61 y.o.   MRN: [id number] ? ?Chief Complaint  ?Patient presents with  ? Acute Visit  ?  Pt states she is coughing up green thick mucus. Pt is on Dupixent shots. Pt is on her last day of 40mg  of Prednisone 5 day course of treatment. Pt has wheezing, coughing, chest rattling, chest tightness, some sinus issues, and sore throat. This started to occur a few days after her last shot.   ? ? ?Referring provider: ?Merri BrunettePharr, Walter, MD ? ?HPI: ?61 year old female, never smoker followed for severe persistent asthma on Dupixent.  She is a patient Dr. Myrlene BrokerIcard's and last seen in office on 12/24/2021.  Past medical history significant for hypertension, allergic rhinitis, HLD. ? ?TEST/EVENTS:  ?01/01/2020: Eos 200, RAST positive to multiple environmental triggers; IgE 91 ?02/12/2020 PFTs: FVC 95, FEV1 91, ratio 74, TLC 104, DLCOcor 113.  No BD.  Mild obstructive airway disease ?10/08/2021 CT cardiac: No LAD present.  Visualized lungs show no underlying pulmonary etiology and are clear. ? ?12/24/2021: OV with Dr. Tonia BroomsIcard for follow-up.  Reported that she has only had 1 exacerbation in the past year with URI symptoms since starting on Dupixent.  Back on Breo 200 and doing well with this. ? ?02/08/2022: Today-acute visit ?Patient presents today for worsening of her asthma symptoms.  Symptoms started earlier last week.  She started having a dry, hacking cough and associated wheezing and chest tightness.  She also reported some increased sinus symptoms including nasal congestion and drainage.  Over the last few days, she has developed hoarseness and scratchy throat.  Cough has also become productive with green to yellow thick mucus.  Nasal drainage is the same.  She is on day 5 of a 5-day course of prednisone, prescribed by televisit doc with minimal improvement in symptoms.  She reports that this is the second time over the last few months that she has had similar symptoms.  Feels like  there is a possible correlation with her Dupixent injection timing.  Prior to this spring, she was doing very well and had not had a flare in over a year since starting injections.  Continues on Breo daily; feels like this is the best inhaler for her.  She has had to increase her use of her albuterol rescue over the last week.  She continues on Clarinex and Singulair daily.  Denies any fevers, hemoptysis, lower extremity swelling, headaches. ? ?FeNO 28 ppb ? ?Allergies  ?Allergen Reactions  ? Advair Diskus [Fluticasone-Salmeterol] Itching and Anxiety  ?  Patient states that she felt like she was going crawl out of her skin.  ? ? ?Immunization History  ?Administered Date(s) Administered  ? Influenza Inj Mdck Quad With Preservative 07/10/2017  ? Influenza, High Dose Seasonal PF 10/12/2018, 08/30/2019, 11/10/2020  ? Influenza,inj,Quad PF,6+ Mos 07/11/2019  ? Influenza-Unspecified 07/10/2016  ? PFIZER(Purple Top)SARS-COV-2 Vaccination 12/24/2019, 01/15/2020, 08/14/2020, 07/06/2021  ? ? ?Past Medical History:  ?Diagnosis Date  ? Allergic rhinitis   ? Asthma   ? Hiatal hernia 06/2020  ? History of COVID-19 06/2019  ? Hyperlipidemia   ? Hypertension   ? Osteopenia   ? ? ?Tobacco History: ?Social History  ? ?Tobacco Use  ?Smoking Status Never  ?Smokeless Tobacco Never  ? ?Counseling given: Not Answered ? ? ?Outpatient Medications Prior to Visit  ?Medication Sig Dispense Refill  ? albuterol (PROVENTIL) (2.5 MG/3ML) 0.083% nebulizer solution Take 3 mLs (2.5 mg total) by nebulization every  6 (six) hours as needed for wheezing or shortness of breath. 75 mL 11  ? albuterol (VENTOLIN HFA) 108 (90 Base) MCG/ACT inhaler Inhale 1 puff into the lungs as needed. 18 g 6  ? albuterol (VENTOLIN HFA) 108 (90 Base) MCG/ACT inhaler Inhale 2 puffs into the lungs every 6 (six) hours as needed for wheezing or shortness of breath. 18 g 3  ? BREO ELLIPTA 200-25 MCG/INH AEPB Inhale 1 puff into the lungs daily.    ? clotrimazole-betamethasone  (LOTRISONE) cream Apply 1 application topically as needed.     ? desloratadine (CLARINEX) 5 MG tablet Take 1 tablet (5 mg total) by mouth daily. 1 tablet 11  ? desloratadine (CLARINEX) 5 MG tablet Take 1 tablet (5 mg total) by mouth daily. 90 tablet 3  ? Dupilumab (DUPIXENT) 300 MG/2ML SOPN Inject 300 mg into the skin every 14 (fourteen) days. Copay card: Johnney Killian- [id number], PCN- [id number], Group- [id number], ID- [id number] 12 mL 1  ? EPINEPHrine 0.3 mg/0.3 mL IJ SOAJ injection Inject into the muscle as directed.    ? estrogen, conjugated,-medroxyprogesterone (PREMPRO) 0.45-1.5 MG tablet Take 1 tablet by mouth daily. (Patient taking differently: Take 1 tablet by mouth. Take every other day) 90 tablet 3  ? fluticasone (FLONASE) 50 MCG/ACT nasal spray Place 2 sprays into both nostrils daily. 16 g 2  ? fluticasone (FLONASE) 50 MCG/ACT nasal spray Place 2 sprays into both nostrils daily. 1 g 3  ? fluticasone furoate-vilanterol (BREO ELLIPTA) 200-25 MCG/ACT AEPB Inhale 1 puff into the lungs daily. 90 each 3  ? montelukast (SINGULAIR) 10 MG tablet Take 1 tablet (10 mg total) by mouth at bedtime. 30 tablet 11  ? montelukast (SINGULAIR) 10 MG tablet Take 1 tablet (10 mg total) by mouth at bedtime. 90 tablet 3  ? olmesartan-hydrochlorothiazide (BENICAR HCT) 40-12.5 MG tablet Take 1 tablet by mouth daily.    ? OVER THE COUNTER MEDICATION Vear Clock colon health ?Takes 1 daily    ? Probiotic Product (PROBIOTIC COLON SUPPORT PO) Take 1 tablet by mouth at bedtime.    ? rosuvastatin (CRESTOR) 20 MG tablet TAKE 1 TABLET ONCE DAILY. 90 tablet 3  ? Spacer/Aero-Holding Chambers (AEROCHAMBER MV) inhaler Use as instructed 1 each 0  ? ?No facility-administered medications prior to visit.  ? ? ? ?Review of Systems:  ? ?Constitutional: No weight loss or gain, night sweats, fevers, chills, fatigue, or lassitude. ?HEENT: No headaches, difficulty swallowing, tooth/dental problems. No sneezing, itching, ear ache. +nasal congestion/purulent drainage,  scratchy throat ?CV:  No chest pain, orthopnea, PND, swelling in lower extremities, anasarca, dizziness, palpitations, syncope ?Resp: +shortness of breath with exertion (some improvement); productive cough; wheezing. No hemoptysis.  No chest wall deformity ?GI:  No heartburn, indigestion, abdominal pain, nausea, vomiting, diarrhea, change in bowel habits, loss of appetite, bloody stools.  ?Skin: No rash, lesions, ulcerations ?MSK:  No joint pain or swelling.  No decreased range of motion.  No back pain. ?Neuro: No dizziness or lightheadedness.  ?Psych: No depression or anxiety. Mood stable.  ? ? ? ?Physical Exam: ? ?BP 122/64 (BP Location: Right Arm, Patient Position: Sitting, Cuff Size: Normal)   Pulse 78   Temp 98.1 ?F (36.7 ?C) (Oral)   Ht 5\' 5"  (1.651 m)   Wt 156 lb (70.8 kg)   SpO2 99%   BMI 25.96 kg/m?  ? ?GEN: Pleasant, interactive, well-appearing; in no acute distress. ?HEENT:  Normocephalic and atraumatic. EACs patent bilaterally. TM pearly gray with present light reflex bilaterally. PERRLA. Sclera white.  Nasal turbinates erythematous/inflamed, moist and patent bilaterally. White rhinorrhea present. Oropharynx erythematous and moist, without exudate or edema. No lesions, ulcerations ?NECK:  Supple w/ fair ROM. No JVD present. Normal carotid impulses w/o bruits. Thyroid symmetrical with no goiter or nodules palpated. No lymphadenopathy.   ?CV: RRR, no m/r/g, no peripheral edema. Pulses intact, +2 bilaterally. No cyanosis, pallor or clubbing. ?PULMONARY:  Unlabored, regular breathing. Minimal scattered wheezes bilaterally A&P. No accessory muscle use. No dullness to percussion. ?GI: BS present and normoactive. Soft, non-tender to palpation. No organomegaly or masses detected. No CVA tenderness. ?MSK: No erythema, warmth or tenderness. Cap refil <2 sec all extrem. No deformities or joint swelling noted.  ?Neuro: A/Ox3. No focal deficits noted.   ?Skin: Warm, no lesions or rashe ?Psych: Normal affect and  behavior. Judgement and thought content appropriate.  ? ? ? ?Lab Results: ? ?CBC ?   ?Component Value Date/Time  ? WBC 11.5 (H) 01/01/2020 1093  ? RBC 5.39 (H) 01/01/2020 2355  ? HGB 15.6 (H) 01/01/2020 0958  ? HCT 47.

## 2022-02-08 NOTE — Assessment & Plan Note (Signed)
Increased purulent drainage - tx with abx course. Advised to continue intranasal steroid; add astelin. Continue saline nasal irrigations.  ?

## 2022-02-09 ENCOUNTER — Other Ambulatory Visit (INDEPENDENT_AMBULATORY_CARE_PROVIDER_SITE_OTHER): Payer: BC Managed Care – PPO

## 2022-02-09 DIAGNOSIS — J209 Acute bronchitis, unspecified: Secondary | ICD-10-CM | POA: Diagnosis not present

## 2022-02-09 DIAGNOSIS — J4551 Severe persistent asthma with (acute) exacerbation: Secondary | ICD-10-CM | POA: Diagnosis not present

## 2022-02-09 LAB — CBC WITH DIFFERENTIAL/PLATELET
Basophils Absolute: 0 10*3/uL (ref 0.0–0.1)
Basophils Relative: 0.2 % (ref 0.0–3.0)
Eosinophils Absolute: 0 10*3/uL (ref 0.0–0.7)
Eosinophils Relative: 0.3 % (ref 0.0–5.0)
HCT: 43.5 % (ref 36.0–46.0)
Hemoglobin: 14.4 g/dL (ref 12.0–15.0)
Lymphocytes Relative: 24.2 % (ref 12.0–46.0)
Lymphs Abs: 3.4 10*3/uL (ref 0.7–4.0)
MCHC: 33.2 g/dL (ref 30.0–36.0)
MCV: 86.8 fl (ref 78.0–100.0)
Monocytes Absolute: 0.9 10*3/uL (ref 0.1–1.0)
Monocytes Relative: 6.3 % (ref 3.0–12.0)
Neutro Abs: 9.7 10*3/uL — ABNORMAL HIGH (ref 1.4–7.7)
Neutrophils Relative %: 69 % (ref 43.0–77.0)
Platelets: 283 10*3/uL (ref 150.0–400.0)
RBC: 5.01 Mil/uL (ref 3.87–5.11)
RDW: 13 % (ref 11.5–15.5)
WBC: 14 10*3/uL — ABNORMAL HIGH (ref 4.0–10.5)

## 2022-02-09 NOTE — Progress Notes (Signed)
Please notify pt CBC with diff showed elevated WBC and neutrophils, likely due to prednisone use. We will recheck in 4 weeks after completion of prednisone to ensure this has resolved. Thanks.

## 2022-02-10 ENCOUNTER — Telehealth: Payer: Self-pay | Admitting: Pulmonary Disease

## 2022-02-10 MED ORDER — ALBUTEROL SULFATE (2.5 MG/3ML) 0.083% IN NEBU: 2.5000 mg | INHALATION_SOLUTION | Freq: Four times a day (QID) | RESPIRATORY_TRACT | 11 refills | Status: DC | PRN

## 2022-02-10 NOTE — Telephone Encounter (Signed)
Rx for pt's albuterol neb sol has been sent to preferred pharmacy. Nothing further needed. ?

## 2022-02-22 ENCOUNTER — Encounter: Payer: Self-pay | Admitting: Nurse Practitioner

## 2022-02-22 ENCOUNTER — Ambulatory Visit (INDEPENDENT_AMBULATORY_CARE_PROVIDER_SITE_OTHER): Payer: BC Managed Care – PPO | Admitting: Nurse Practitioner

## 2022-02-22 DIAGNOSIS — J4550 Severe persistent asthma, uncomplicated: Secondary | ICD-10-CM

## 2022-02-22 DIAGNOSIS — J040 Acute laryngitis: Secondary | ICD-10-CM

## 2022-02-22 DIAGNOSIS — J309 Allergic rhinitis, unspecified: Secondary | ICD-10-CM | POA: Diagnosis not present

## 2022-02-22 NOTE — Assessment & Plan Note (Addendum)
Improved. Advised continuing therapies to limit vocal cord irritation until completely resolved.  ?

## 2022-02-22 NOTE — Assessment & Plan Note (Signed)
Well-controlled on current regimen. Follow up with allergist as scheduled.  ?

## 2022-02-22 NOTE — Progress Notes (Signed)
? ?@Patient  ID: , female    DOB: 01-Aug-1961, 61 y.o.   MRN: [id number] ? ?Chief Complaint  ?Patient presents with  ? Follow-up  ?  Follow up. Patient has no complaints.   ? ? ?Referring provider: ?[id number], MD ? ?HPI: ?61 year old female, never smoker followed for severe persistent asthma on Dupixent.  She is a patient Dr. 67 and last seen in office on 02/08/2022.  Past medical history significant for hypertension, allergic rhinitis, HLD. ? ?TEST/EVENTS:  ?01/01/2020: Eos 200, RAST positive to multiple environmental triggers; IgE 91 ?02/12/2020 PFTs: FVC 95, FEV1 91, ratio 74, TLC 104, DLCO corrected for alveolar volume 113.  No BD.  Normal PFTs ?10/08/2021 CT cardiac: No LAD present.  Visualized lung showed no underlying pulmonary etiology and are clear. ? ?12/24/2021: OV with Dr. 12/26/2021 for follow-up.  Reported that she has only had 1 exacerbation in the past year with URI symptoms since starting on Dupixent.  Back on Breo 200 and doing well with this. ? ?02/08/2022: OV with Malanie Koloski NP for acute asthma symptoms.  Reported a dry, hacking cough and wheezing with chest tightness.  Also had some increase in his symptoms including nasal congestion and drainage.  She also ended up developing a scratchy throat and hoarseness.  Treated by televisit doc with 5-day course of prednisone, which she was completing that day.  Reported this was the second time over the last few months she has had similar symptoms.  Felt like there was a correlation with her Dupixent injection timing.  Prior to this spring, she was doing well and had not had a flare in over a year. FeNO mildly elevated at 28 ppb -extended current prednisone with taper and added Augmentin course for rhinosinusitis and acute bronchitis.  Check CBC with differential to evaluate for eosinophilia with Dupixent -eos 0.  Advised voice rest, cough and postnasal drainage control measures for laryngitis.  Continued on Breo 200 -advised that we may need to  consider changing from DPI if hoarseness persists. ? ?02/22/2022: Today-follow-up ?Patient presents today for follow-up.  Reports feeling significantly better.  Still has some mild hoarseness but feels like it is getting better every day.  Rare, dry cough also significantly improved.  She has not had any more chest tightness or wheezing.  URI symptoms have also resolved.  She continues on Breo inhaler.  Has been using Astelin and Flonase nasal sprays.  Takes Clarinex and Singulair for trigger prevention.  Recently had dupixent injection.  ? ?Allergies  ?Allergen Reactions  ? Advair Diskus [Fluticasone-Salmeterol] Itching and Anxiety  ?  Patient states that she felt like she was going crawl out of her skin.  ? ? ?Immunization History  ?Administered Date(s) Administered  ? Influenza Inj Mdck Quad With Preservative 07/10/2017  ? Influenza, High Dose Seasonal PF 10/12/2018, 08/30/2019, 11/10/2020  ? Influenza,inj,Quad PF,6+ Mos 07/11/2019  ? Influenza-Unspecified 07/10/2016  ? PFIZER(Purple Top)SARS-COV-2 Vaccination 12/24/2019, 01/15/2020, 08/14/2020, 07/06/2021  ? ? ?Past Medical History:  ?Diagnosis Date  ? Allergic rhinitis   ? Asthma   ? Hiatal hernia 06/2020  ? History of COVID-19 06/2019  ? Hyperlipidemia   ? Hypertension   ? Osteopenia   ? ? ?Tobacco History: ?Social History  ? ?Tobacco Use  ?Smoking Status Never  ?Smokeless Tobacco Never  ? ?Counseling given: Not Answered ? ? ?Outpatient Medications Prior to Visit  ?Medication Sig Dispense Refill  ? albuterol (PROVENTIL) (2.5 MG/3ML) 0.083% nebulizer solution Take 3 mLs (2.5 mg total) by nebulization  every 6 (six) hours as needed for wheezing or shortness of breath. 75 mL 11  ? albuterol (VENTOLIN HFA) 108 (90 Base) MCG/ACT inhaler Inhale 1 puff into the lungs as needed. 18 g 6  ? albuterol (VENTOLIN HFA) 108 (90 Base) MCG/ACT inhaler Inhale 2 puffs into the lungs every 6 (six) hours as needed for wheezing or shortness of breath. 18 g 3  ?  amoxicillin-clavulanate (AUGMENTIN) 875-125 MG tablet Take 1 tablet by mouth 2 (two) times daily. 14 tablet 0  ? azelastine (ASTELIN) 0.1 % nasal spray Place 2 sprays into both nostrils 2 (two) times daily. Use in each nostril as directed 30 mL 3  ? BREO ELLIPTA 200-25 MCG/INH AEPB Inhale 1 puff into the lungs daily.    ? clotrimazole-betamethasone (LOTRISONE) cream Apply 1 application topically as needed.     ? desloratadine (CLARINEX) 5 MG tablet Take 1 tablet (5 mg total) by mouth daily. 1 tablet 11  ? desloratadine (CLARINEX) 5 MG tablet Take 1 tablet (5 mg total) by mouth daily. 90 tablet 3  ? Dupilumab (DUPIXENT) 300 MG/2ML SOPN Inject 300 mg into the skin every 14 (fourteen) days. Copay card: Johnney Killian- [id number], PCN- [employer], Group- [id number], ID- [id number] 12 mL 1  ? EPINEPHrine 0.3 mg/0.3 mL IJ SOAJ injection Inject into the muscle as directed.    ? estrogen, conjugated,-medroxyprogesterone (PREMPRO) 0.45-1.5 MG tablet Take 1 tablet by mouth daily. (Patient taking differently: Take 1 tablet by mouth. Take every other day) 90 tablet 3  ? fluticasone (FLONASE) 50 MCG/ACT nasal spray Place 2 sprays into both nostrils daily. 16 g 2  ? fluticasone (FLONASE) 50 MCG/ACT nasal spray Place 2 sprays into both nostrils daily. 1 g 3  ? fluticasone furoate-vilanterol (BREO ELLIPTA) 200-25 MCG/ACT AEPB Inhale 1 puff into the lungs daily. 90 each 3  ? montelukast (SINGULAIR) 10 MG tablet Take 1 tablet (10 mg total) by mouth at bedtime. 30 tablet 11  ? montelukast (SINGULAIR) 10 MG tablet Take 1 tablet (10 mg total) by mouth at bedtime. 90 tablet 3  ? olmesartan-hydrochlorothiazide (BENICAR HCT) 40-12.5 MG tablet Take 1 tablet by mouth daily.    ? OVER THE COUNTER MEDICATION Vear Clock colon health ?Takes 1 daily    ? predniSONE (DELTASONE) 10 MG tablet 4 tabs for 2 days, then 3 tabs for 2 days, 2 tabs for 2 days, then 1 tab for 2 days, then stop 20 tablet 0  ? Probiotic Product (PROBIOTIC COLON SUPPORT PO) Take 1 tablet by mouth  at bedtime.    ? rosuvastatin (CRESTOR) 20 MG tablet TAKE 1 TABLET ONCE DAILY. 90 tablet 3  ? Spacer/Aero-Holding Chambers (AEROCHAMBER MV) inhaler Use as instructed 1 each 0  ? ?No facility-administered medications prior to visit.  ? ? ? ?Review of Systems:  ? ?Constitutional: No weight loss or gain, night sweats, fevers, chills, fatigue, or lassitude. ?HEENT: No headaches, difficulty swallowing, tooth/dental problems, or sore throat. No sneezing, itching, ear ache, nasal congestion, or post nasal drip. +hoarseness (improved) ?CV:  No chest pain, orthopnea, PND, swelling in lower extremities, anasarca, dizziness, palpitations, syncope ?Resp: +minimal dry cough (improved). No shortness of breath with exertion or at rest. No excess mucus or change in color of mucus. No hemoptysis. No wheezing.  No chest wall deformity ?GI:  No heartburn, indigestion, abdominal pain, nausea, vomiting, diarrhea, change in bowel habits, loss of appetite, bloody stools.  ?Skin: No rash, lesions, ulcerations ?MSK:  No joint pain or swelling.  No decreased range of  motion.  No back pain. ?Neuro: No dizziness or lightheadedness.  ?Psych: No depression or anxiety. Mood stable.  ? ? ? ?Physical Exam: ? ?BP 126/60 (BP Location: Right Arm, Patient Position: Sitting, Cuff Size: Normal)   Pulse 92   Temp 98.3 ?F (36.8 ?C) (Oral)   Ht  (1.651 m)   Wt 158 lb 3.2 oz (71.8 kg)   SpO2 99%   BMI 26.33 kg/m?  ? ?GEN: Pleasant, interactive, well-appearing; in no acute distress. ?HEENT:  Normocephalic and atraumatic. PERRLA. Sclera white. Nasal turbinates pink, moist and patent bilaterally. No rhinorrhea present. Oropharynx pink and moist, without exudate or edema. No lesions, ulcerations, or postnasal drip.  ?NECK:  Supple w/ fair ROM. No JVD present. Normal carotid impulses w/o bruits. Thyroid symmetrical with no goiter or nodules palpated. No lymphadenopathy.   ?CV: RRR, no m/r/g, no peripheral edema. Pulses intact, +2 bilaterally. No  cyanosis, pallor or clubbing. ?PULMONARY:  Unlabored, regular breathing. Clear bilaterally A&P w/o wheezes/rales/rhonchi. No accessory muscle use. No dullness to percussion. ?GI: BS present and normoactive. Soft, non-tender to palpation. N

## 2022-02-22 NOTE — Assessment & Plan Note (Addendum)
Resolved exacerbation. Clinically significantly improved. Continue current regimen.  ? ?Patient Instructions  ?Continue Breo 1 puff daily  ?Continue Albuterol inhaler 2 puffs or 3 mL neb every 6 hours as needed for shortness of breath or wheezing. Notify if symptoms persist despite rescue inhaler/neb use. ?Continue Clarinex 5 mg daily for allergies  ?Continue flonase 2 sprays each nostril daily  ?Continue singulair 10 mg daily  ?Continue saline nasal spray or irrigations 1-2 times a day  ?Continue dupixent injections as scheduled ?  ?Avoid throat clearing. Frequent sips of water. Suck on sugar free hard candies throughout the day. ?  ?Follow up in 3 months with Dr. Tonia Brooms or Philis Nettle. If symptoms do not improve or worsen, please contact office for sooner follow up or seek emergency care. ? ? ?

## 2022-02-22 NOTE — Patient Instructions (Addendum)
Continue Breo 1 puff daily  ?Continue Albuterol inhaler 2 puffs or 3 mL neb every 6 hours as needed for shortness of breath or wheezing. Notify if symptoms persist despite rescue inhaler/neb use. ?Continue Clarinex 5 mg daily for allergies  ?Continue flonase 2 sprays each nostril daily  ?Continue singulair 10 mg daily  ?Continue saline nasal spray or irrigations 1-2 times a day  ?Continue dupixent injections as scheduled ?  ?Avoid throat clearing. Frequent sips of water. Suck on sugar free hard candies throughout the day. ?  ?Follow up in 3 months with Dr. Valeta Harms or Alanson Aly. If symptoms do not improve or worsen, please contact office for sooner follow up or seek emergency care. ?

## 2022-03-22 ENCOUNTER — Telehealth: Payer: Self-pay | Admitting: Pharmacist

## 2022-03-22 NOTE — Telephone Encounter (Signed)
Received fax from [facility] for Dupixent authorization renewal. Completed and faxed forms.  Fax: [phone] Phone: [phone] PA # [phone]  Chesley Mires, PharmD, MPH, BCPS, CPP Clinical Pharmacist (Rheumatology and Pulmonology)

## 2022-03-24 NOTE — Telephone Encounter (Signed)
Received notification from [facility] regarding a prior authorization for DUPIXENT. Authorization has been APPROVED from 03/22/22 to 03/23/23.   Patient must continue to fill through [facility]: [phone]  Authorization # [id number]  Chesley Mires, PharmD, MPH, BCPS, CPP Clinical Pharmacist (Rheumatology and Pulmonology)

## 2022-04-06 ENCOUNTER — Other Ambulatory Visit: Payer: Self-pay | Admitting: Internal Medicine

## 2022-04-06 DIAGNOSIS — Z1231 Encounter for screening mammogram for malignant neoplasm of breast: Secondary | ICD-10-CM

## 2022-05-12 ENCOUNTER — Ambulatory Visit: Payer: BC Managed Care – PPO

## 2022-05-16 ENCOUNTER — Ambulatory Visit
Admission: RE | Admit: 2022-05-16 | Discharge: 2022-05-16 | Disposition: A | Discharge status: Home / Self Care | Payer: BC Managed Care – PPO | Source: Ambulatory Visit | Attending: Internal Medicine | Admitting: Internal Medicine

## 2022-05-16 DIAGNOSIS — Z1231 Encounter for screening mammogram for malignant neoplasm of breast: Secondary | ICD-10-CM

## 2022-05-25 ENCOUNTER — Ambulatory Visit: Payer: BC Managed Care – PPO | Admitting: Pulmonary Disease

## 2022-05-25 ENCOUNTER — Ambulatory Visit
Admission: RE | Admit: 2022-05-25 | Discharge: 2022-05-25 | Disposition: A | Discharge status: Home / Self Care | Payer: BC Managed Care – PPO | Source: Ambulatory Visit | Attending: Internal Medicine | Admitting: Internal Medicine

## 2022-05-26 ENCOUNTER — Ambulatory Visit: Payer: BC Managed Care – PPO | Admitting: Pulmonary Disease

## 2022-05-26 ENCOUNTER — Encounter: Payer: Self-pay | Admitting: Pulmonary Disease

## 2022-05-26 VITALS — BP 110/60 | HR 84 | Ht 65.0 in | Wt 155.6 lb

## 2022-05-26 DIAGNOSIS — J309 Allergic rhinitis, unspecified: Secondary | ICD-10-CM

## 2022-05-26 DIAGNOSIS — R0609 Other forms of dyspnea: Secondary | ICD-10-CM | POA: Diagnosis not present

## 2022-05-26 DIAGNOSIS — J4550 Severe persistent asthma, uncomplicated: Secondary | ICD-10-CM

## 2022-05-26 MED ORDER — FLUTICASONE FUROATE-VILANTEROL 200-25 MCG/ACT IN AEPB: 1.0000 | INHALATION_SPRAY | Freq: Every day | RESPIRATORY_TRACT | 3 refills | Status: DC

## 2022-05-26 NOTE — Patient Instructions (Signed)
Thank you for visiting Dr. Tonia Brooms at [facility]. Today we recommend the following:  Meds ordered this encounter  Medications   fluticasone furoate-vilanterol (BREO ELLIPTA) 200-25 MCG/ACT AEPB    Sig: Inhale 1 puff into the lungs daily.    Dispense:  90 each    Refill:  3   Return in about 1 year (around 05/27/2023) for with APP or Dr. Tonia Brooms.    Please do your part to reduce the spread of COVID-19.

## 2022-05-26 NOTE — Progress Notes (Signed)
Synopsis: Referred in March 2021 for asthma by [name], MD  Subjective:   PATIENT ID: Carol Acosta GENDER: female DOB: Sep 04, 1961, MRN: [id number]  Chief Complaint  Patient presents with   Follow-up    Follow-up    This is a 61 year old female past medical history of asthma, hypertension hyperlipidemia, allergic rhinitis.  Patient presents to the office for evaluation of asthma. She has had asthma since child. She has had allergy skin testing with multiple positivities. She had September covid mild symptoms, dx sept 3rd. She always gets flare ups in aug, September, again with issues in the spring time. She has seen cardiology for management of HTN and HLD. She retired Nov 1st. Her anxiety is much better down. She still having recurrent flares. She is married, twin 19 years olds, and 60 year old. Currently managed with Breo 200, was on prednisone three times this past year.   OV 01/01/2020: Patient with ongoing respiratory symptoms.  Very anxious today.  She has been trying to work out and start trying to take care of herself.  We talked about various other medications that she has been using.  She uses antihistamines on occasion.  She has been on Singulair in the past but not currently.  Seldom uses her nebulizer or albuterol.  Even though she does state that she probably should when she has symptoms but she does not always reach to use her albuterol.    OV 10/08/2020: Here today for follow-up history of asthma. Patient last seen in the office September 2021 by Rikki Spearing, NP patient was put on a prednisone taper recommend to continue use of Breo.  Today doing well today.  Had a slight flare over the Christmas holiday of able to maintain with just use of as needed albuterol she is religiously using her antihistamines and Singulair.  We reviewed her previous lab work approximately 9 months ago which included a Product manager and CBC which had a absolute eosinophil count of 200 and IgE of 91  and positive environmental allergens.  OV 12/24/2021: Here today for follow-up.  Had some issues regarding obtaining Breo and insurance wanting to change medications.  However after starting Dupixent this is changed her life.  She has only had 1 exacerbation this past year.  In 1 appointment for a office visit regarding upper respiratory tract symptoms which she normally had multiple per year.  She is back on Breo 200 and doing well with this.  She needs refills of all of her allergy medications, inhalers and albuterol.  OV 05/26/2022: Here today for follow-up regarding severe persistent asthma symptoms.  Doing well on Dupixent.  Currently managed with Breo 200.Last seen in the office by Rhunette Croft, NP on 02/22/2022.  Taking Astelin, Flonase nasal spray, Clarinex plus Singulair.  Compliant with Dupixent injections.  From respiratory standpoint doing fine.  Feels better after her exacerbation in the beginning of May.    Past Medical History:  Diagnosis Date   Allergic rhinitis    Asthma    Hiatal hernia 06/2020   History of COVID-19 06/2019   Hyperlipidemia    Hypertension    Osteopenia      Family History  Problem Relation Age of Onset   Asthma Mother    Allergies Mother    Hypertension Mother    Thyroid disease Mother    COPD Maternal Grandmother    Hypertension Maternal Grandmother    Allergic rhinitis Brother    Dementia Brother    Allergic  rhinitis Brother    Breast cancer Neg Hx      Past Surgical History:  Procedure Laterality Date   CESAREAN SECTION  2000   ENDOMETRIAL ABLATION     NASAL SINUS SURGERY  1989    Social History   Socioeconomic History   Marital status: Married    Spouse name: Not on file   Number of children: 3   Years of education: Not on file   Highest education level: Not on file  Occupational History   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  Tobacco Use   Smoking status: Never   Smokeless tobacco: Never  Vaping Use   Vaping Use: Never used   Substance and Sexual Activity   Alcohol use: Not Currently   Drug use: No   Sexual activity: Not Currently    Birth control/protection: None  Other Topics Concern   Not on file  Social History Narrative   Not on file   Social Determinants of Health   Financial Resource Strain: Not on file  Food Insecurity: Not on file  Transportation Needs: Not on file  Physical Activity: Not on file  Stress: Not on file  Social Connections: Not on file  Intimate Partner Violence: Not on file     Allergies  Allergen Reactions   Advair Diskus [Fluticasone-Salmeterol] Itching and Anxiety    Patient states that she felt like she was going crawl out of her skin.     Outpatient Medications Prior to Visit  Medication Sig Dispense Refill   albuterol (PROVENTIL) (2.5 MG/3ML) 0.083% nebulizer solution Take 3 mLs (2.5 mg total) by nebulization every 6 (six) hours as needed for wheezing or shortness of breath. 75 mL 11   albuterol (VENTOLIN HFA) 108 (90 Base) MCG/ACT inhaler Inhale 1 puff into the lungs as needed. 18 g 6   BREO ELLIPTA 200-25 MCG/INH AEPB Inhale 1 puff into the lungs daily.     Dupilumab (DUPIXENT) 300 MG/2ML SOPN Inject 300 mg into the skin every 14 (fourteen) days. Copay card: Johnney KillianBI- [id number], PCN- [id number], Group- [id number], ID- [id number] 12 mL 1   montelukast (SINGULAIR) 10 MG tablet Take 1 tablet (10 mg total) by mouth at bedtime. 30 tablet 11   Spacer/Aero-Holding Chambers (AEROCHAMBER MV) inhaler Use as instructed 1 each 0   amoxicillin-clavulanate (AUGMENTIN) 875-125 MG tablet Take 1 tablet by mouth 2 (two) times daily. (Patient not taking: Reported on 05/26/2022) 14 tablet 0   azelastine (ASTELIN) 0.1 % nasal spray Place 2 sprays into both nostrils 2 (two) times daily. Use in each nostril as directed (Patient not taking: Reported on 05/26/2022) 30 mL 3   clotrimazole-betamethasone (LOTRISONE) cream Apply 1 application topically as needed.      desloratadine (CLARINEX) 5 MG tablet Take  1 tablet (5 mg total) by mouth daily. 90 tablet 3   EPINEPHrine 0.3 mg/0.3 mL IJ SOAJ injection Inject into the muscle as directed.     estrogen, conjugated,-medroxyprogesterone (PREMPRO) 0.45-1.5 MG tablet Take 1 tablet by mouth daily. (Patient taking differently: Take 1 tablet by mouth. Take every other day) 90 tablet 3   fluticasone (FLONASE) 50 MCG/ACT nasal spray Place 2 sprays into both nostrils daily. (Patient not taking: Reported on 05/26/2022) 16 g 2   olmesartan-hydrochlorothiazide (BENICAR HCT) 40-12.5 MG tablet Take 1 tablet by mouth daily.     OVER THE COUNTER MEDICATION [facility] [employer] colon health Takes 1 daily     predniSONE (DELTASONE) 10 MG tablet 4 tabs for 2 days, then 3 tabs for 2  days, 2 tabs for 2 days, then 1 tab for 2 days, then stop 20 tablet 0   Probiotic Product (PROBIOTIC COLON SUPPORT PO) Take 1 tablet by mouth at bedtime.     rosuvastatin (CRESTOR) 20 MG tablet TAKE 1 TABLET ONCE DAILY. 90 tablet 3   albuterol (VENTOLIN HFA) 108 (90 Base) MCG/ACT inhaler Inhale 2 puffs into the lungs every 6 (six) hours as needed for wheezing or shortness of breath. 18 g 3   desloratadine (CLARINEX) 5 MG tablet Take 1 tablet (5 mg total) by mouth daily. 1 tablet 11   fluticasone (FLONASE) 50 MCG/ACT nasal spray Place 2 sprays into both nostrils daily. 1 g 3   montelukast (SINGULAIR) 10 MG tablet Take 1 tablet (10 mg total) by mouth at bedtime. 90 tablet 3   No facility-administered medications prior to visit.    Review of Systems  Constitutional:  Negative for chills, fever, malaise/fatigue and weight loss.  HENT:  Negative for hearing loss, sore throat and tinnitus.   Eyes:  Negative for blurred vision and double vision.  Respiratory:  Positive for cough. Negative for hemoptysis, sputum production, shortness of breath, wheezing and stridor.   Cardiovascular:  Negative for chest pain, palpitations, orthopnea, leg swelling and PND.  Gastrointestinal:  Negative for abdominal pain,  constipation, diarrhea, heartburn, nausea and vomiting.  Genitourinary:  Negative for dysuria, hematuria and urgency.  Musculoskeletal:  Negative for joint pain and myalgias.  Skin:  Negative for itching and rash.  Neurological:  Negative for dizziness, tingling, weakness and headaches.  Endo/Heme/Allergies:  Negative for environmental allergies. Does not bruise/bleed easily.  Psychiatric/Behavioral:  Negative for depression. The patient is not nervous/anxious and does not have insomnia.   All other systems reviewed and are negative.    Objective:  Physical Exam Vitals reviewed.  Constitutional:      General: She is not in acute distress.    Appearance: She is well-developed.  HENT:     Head: Normocephalic and atraumatic.  Eyes:     General: No scleral icterus.    Conjunctiva/sclera: Conjunctivae normal.     Pupils: Pupils are equal, round, and reactive to light.  Neck:     Vascular: No JVD.     Trachea: No tracheal deviation.  Cardiovascular:     Rate and Rhythm: Normal rate and regular rhythm.     Heart sounds: Normal heart sounds. No murmur heard. Pulmonary:     Effort: Pulmonary effort is normal. No tachypnea, accessory muscle usage or respiratory distress.     Breath sounds: No stridor. No wheezing, rhonchi or rales.  Abdominal:     General: There is no distension.     Palpations: Abdomen is soft.     Tenderness: There is no abdominal tenderness.  Musculoskeletal:        General: No tenderness.     Cervical back: Neck supple.  Lymphadenopathy:     Cervical: No cervical adenopathy.  Skin:    General: Skin is warm and dry.     Capillary Refill: Capillary refill takes less than 2 seconds.     Findings: No rash.  Neurological:     Mental Status: She is alert and oriented to person, place, and time.  Psychiatric:        Behavior: Behavior normal.      Vitals:   05/26/22 0858  BP: 110/60  Pulse: 84  SpO2: 96%  Weight: 155 lb 9.6 oz (70.6 kg)  Height: 5\' 5"   (1.651 m)  96% on RA BMI Readings from Last 3 Encounters:  05/26/22 25.89 kg/m  02/22/22 26.33 kg/m  02/08/22 25.96 kg/m   Wt Readings from Last 3 Encounters:  05/26/22 155 lb 9.6 oz (70.6 kg)  02/22/22 158 lb 3.2 oz (71.8 kg)  02/08/22 156 lb (70.8 kg)     CBC    Component Value Date/Time   WBC 14.0 (H) 02/09/2022 0933   RBC 5.01 02/09/2022 0933   HGB 14.4 02/09/2022 0933   HCT 43.5 02/09/2022 0933   PLT 283.0 02/09/2022 0933   MCV 86.8 02/09/2022 0933   MCHC 33.2 02/09/2022 0933   RDW 13.0 02/09/2022 0933   LYMPHSABS 3.4 02/09/2022 0933   MONOABS 0.9 02/09/2022 0933   EOSABS 0.0 02/09/2022 0933   BASOSABS 0.0 02/09/2022 0933     Chest Imaging: 05/24/2011 chest x-ray: Evidence of hyperinflation no infiltrate.  Pulmonary Functions Testing Results:    Latest Ref Rng & Units 02/12/2020    9:58 AM  PFT Results  FVC-Pre L 3.37  P  FVC-Predicted Pre % 95  P  FVC-Post L 3.39  P  FVC-Predicted Post % 95  P  Pre FEV1/FVC % % 73  P  Post FEV1/FCV % % 74  P  FEV1-Pre L 2.47  P  FEV1-Predicted Pre % 89  P  FEV1-Post L 2.51  P  DLCO uncorrected ml/min/mmHg 25.74  P  DLCO UNC% % 120  P  DLCO corrected ml/min/mmHg 24.25  P  DLCO COR %Predicted % 113  P  DLVA Predicted % 115  P  TLC L 5.51  P  TLC % Predicted % 104  P  RV % Predicted % 104  P    P Preliminary result    FeNO: None  Pathology: None   Echocardiogram:  08/05/2019: Normal ejection fraction 65 to 70%, grade 1 diastolic dysfunction.  Heart Catheterization: None     Assessment & Plan:     ICD-10-CM   1. Severe persistent asthma without complication  J45.50     2. Exertional dyspnea  R06.09     3. Allergic rhinitis, unspecified seasonality, unspecified trigger  J30.9        Discussion:  This is a 61 year old female, past medical history of asthma childhood, recurrent exacerbations, steroid use several times a year, history of COVID-19, past positive RAST panel, elevated eosinophil count  of 200.  Breakthrough symptoms despite Breo.  Was started on Dupixent over a year ago.  Has been doing really well on Dupixent injections.  Also using nasal spray and antihistamines.  Plan: Continue Breo 200 New prescription for 90 days and 1 year prescription. Continue Singulair and nasal spray. Continue allergy shots with her allergist. Continue Dupixent.  Follow-up with [place] in 1 year or as needed.   Current Outpatient Medications:    albuterol (PROVENTIL) (2.5 MG/3ML) 0.083% nebulizer solution, Take 3 mLs (2.5 mg total) by nebulization every 6 (six) hours as needed for wheezing or shortness of breath., Disp: 75 mL, Rfl: 11   albuterol (VENTOLIN HFA) 108 (90 Base) MCG/ACT inhaler, Inhale 1 puff into the lungs as needed., Disp: 18 g, Rfl: 6   BREO ELLIPTA 200-25 MCG/INH AEPB, Inhale 1 puff into the lungs daily., Disp: , Rfl:    Dupilumab (DUPIXENT) 300 MG/2ML SOPN, Inject 300 mg into the skin every 14 (fourteen) days. Copay card: Johnney Killian- [id number], PCN- LOYALTY, Group- [id number], ID- [id number], Disp: 12 mL, Rfl: 1   fluticasone furoate-vilanterol (BREO ELLIPTA) 200-25 MCG/ACT AEPB, Inhale 1 puff  into the lungs daily., Disp: 90 each, Rfl: 3   montelukast (SINGULAIR) 10 MG tablet, Take 1 tablet (10 mg total) by mouth at bedtime., Disp: 30 tablet, Rfl: 11   Spacer/Aero-Holding Chambers (AEROCHAMBER MV) inhaler, Use as instructed, Disp: 1 each, Rfl: 0   amoxicillin-clavulanate (AUGMENTIN) 875-125 MG tablet, Take 1 tablet by mouth 2 (two) times daily. (Patient not taking: Reported on 05/26/2022), Disp: 14 tablet, Rfl: 0   azelastine (ASTELIN) 0.1 % nasal spray, Place 2 sprays into both nostrils 2 (two) times daily. Use in each nostril as directed (Patient not taking: Reported on 05/26/2022), Disp: 30 mL, Rfl: 3   clotrimazole-betamethasone (LOTRISONE) cream, Apply 1 application topically as needed. , Disp: , Rfl:    desloratadine (CLARINEX) 5 MG tablet, Take 1 tablet (5 mg total) by mouth daily., Disp: 90  tablet, Rfl: 3   EPINEPHrine 0.3 mg/0.3 mL IJ SOAJ injection, Inject into the muscle as directed., Disp: , Rfl:    estrogen, conjugated,-medroxyprogesterone (PREMPRO) 0.45-1.5 MG tablet, Take 1 tablet by mouth daily. (Patient taking differently: Take 1 tablet by mouth. Take every other day), Disp: 90 tablet, Rfl: 3   fluticasone (FLONASE) 50 MCG/ACT nasal spray, Place 2 sprays into both nostrils daily. (Patient not taking: Reported on 05/26/2022), Disp: 16 g, Rfl: 2   olmesartan-hydrochlorothiazide (BENICAR HCT) 40-12.5 MG tablet, Take 1 tablet by mouth daily., Disp: , Rfl:    OVER THE COUNTER MEDICATION, [employer] colon health Takes 1 daily, Disp: , Rfl:    predniSONE (DELTASONE) 10 MG tablet, 4 tabs for 2 days, then 3 tabs for 2 days, 2 tabs for 2 days, then 1 tab for 2 days, then stop, Disp: 20 tablet, Rfl: 0   Probiotic Product (PROBIOTIC COLON SUPPORT PO), Take 1 tablet by mouth at bedtime., Disp: , Rfl:    rosuvastatin (CRESTOR) 20 MG tablet, TAKE 1 TABLET ONCE DAILY., Disp: 90 tablet, Rfl: 3  I spent 40 minutes dedicated to the care of this patient on the date of this encounter to include pre-visit review of records, face-to-face time with the patient discussing conditions above, post visit ordering of testing, clinical documentation with the electronic health record, making appropriate referrals as documented, and communicating necessary findings to members of the patients care team.    Josephine Igo, DO [facility] 05/26/2022 9:16 AM

## 2022-06-01 ENCOUNTER — Other Ambulatory Visit: Payer: Self-pay | Admitting: Pulmonary Disease

## 2022-06-01 DIAGNOSIS — J8283 Eosinophilic asthma: Secondary | ICD-10-CM

## 2022-06-01 DIAGNOSIS — J4550 Severe persistent asthma, uncomplicated: Secondary | ICD-10-CM

## 2022-06-01 NOTE — Telephone Encounter (Signed)
Refill submitted  Josephine Igo, DO [facility] 06/01/2022 8:43 AM

## 2022-07-04 NOTE — Progress Notes (Unsigned)
61 y.o. G2P2 Married Caucasian female here for annual exam.    Taking HRT every other day.  Hot flashes and vaginal dryness are controlled.  No vaginal bleeding.  She wishes to continue.  She has vaginal dryness with reducing the dose of PremPro in the past.   Planning to do the Covid booster and flu vaccine.   Would like to see a dermatologist.  Her family has been to offices in town.   Calcium score was zero.   Has elevated A1C, 5.9.  Husband had an MI.  Patient and husband are now eating better and exercising more.   Patient has lost 6 pounds.   PCP:   [name], MD  No LMP recorded. Patient has had an ablation.           Sexually active: No.  The current method of family planning is Ablation.    Exercising: Yes.     Walks 3-4x/week Smoker:  no  Health Maintenance: Pap:  06-25-20 Neg:Neg HR HPV, 10-29-14 Neg:Neg HR HPV History of abnormal Pap:  yes, years ago in 1990's unsure if had cryotherapy. MMG:  05-25-22 Neg/BiRads1 Colonoscopy:  2015 normal BMD:   09-04-19  Result :Osteopenia of spine--PCP TDaP:  PCP Gardasil:   no HIV: no Hep C: no Screening Labs:  PCP.   reports that she has never smoked. She has never used smokeless tobacco. She reports that she does not currently use alcohol. She reports that she does not use drugs.  Past Medical History:  Diagnosis Date   Allergic rhinitis    Asthma    Hiatal hernia 06/2020   History of COVID-19 06/2019   Hyperlipidemia    Hypertension    Osteopenia     Past Surgical History:  Procedure Laterality Date   CESAREAN SECTION  2000   ENDOMETRIAL ABLATION     NASAL SINUS SURGERY  1989    Current Outpatient Medications  Medication Sig Dispense Refill   albuterol (PROVENTIL) (2.5 MG/3ML) 0.083% nebulizer solution Take 3 mLs (2.5 mg total) by nebulization every 6 (six) hours as needed for wheezing or shortness of breath. 75 mL 11   albuterol (VENTOLIN HFA) 108 (90 Base) MCG/ACT inhaler Inhale 1 puff into the  lungs as needed. 18 g 6   azelastine (OPTIVAR) 0.05 % ophthalmic solution Apply 1 drop to eye 2 (two) times daily.     clotrimazole-betamethasone (LOTRISONE) cream Apply 1 application topically as needed.      DUPIXENT 300 MG/2ML SOPN INJECT 1 PEN UNDER THE SKIN EVERY 14 DAYS 16 mL 1   EPINEPHrine 0.3 mg/0.3 mL IJ SOAJ injection Inject into the muscle as directed.     estrogen, conjugated,-medroxyprogesterone (PREMPRO) 0.45-1.5 MG tablet Take 1 tablet by mouth daily. (Patient taking differently: Take 1 tablet by mouth. Take every other day) 90 tablet 3   fluorometholone (FML) 0.1 % ophthalmic suspension Place 1 drop into the left eye 4 (four) times daily.     fluticasone furoate-vilanterol (BREO ELLIPTA) 200-25 MCG/ACT AEPB Inhale 1 puff into the lungs daily. 90 each 3   montelukast (SINGULAIR) 10 MG tablet Take 1 tablet (10 mg total) by mouth at bedtime. 30 tablet 11   olmesartan-hydrochlorothiazide (BENICAR HCT) 40-12.5 MG tablet Take 1 tablet by mouth daily.     OVER THE COUNTER MEDICATION [employer] colon health Takes 1 daily     RYALTRIS [phone] MCG/ACT SUSP Place 2 sprays into both nostrils 2 (two) times daily.     Spacer/Aero-Holding Chambers (AEROCHAMBER MV)  inhaler Use as instructed 1 each 0   desloratadine (CLARINEX) 5 MG tablet Take 1 tablet (5 mg total) by mouth daily. 90 tablet 3   No current facility-administered medications for this visit.    Family History  Problem Relation Age of Onset   Asthma Mother    Allergies Mother    Hypertension Mother    Thyroid disease Mother    COPD Maternal Grandmother    Hypertension Maternal Grandmother    Allergic rhinitis Brother    Dementia Brother    Allergic rhinitis Brother    Breast cancer Neg Hx     Review of Systems  All other systems reviewed and are negative.   Exam:   BP (!) 104/58   Pulse 84   Ht 5\' 5"  (1.651 m)   Wt 150 lb (68 kg)   SpO2 96%   BMI 24.96 kg/m     General appearance: alert, cooperative and appears  stated age Head: normocephalic, without obvious abnormality, atraumatic Neck: no adenopathy, supple, symmetrical, trachea midline and thyroid normal to inspection and palpation Lungs: clear to auscultation bilaterally Breasts: normal appearance, no masses or tenderness, No nipple retraction or dimpling, No nipple discharge or bleeding, No axillary adenopathy Heart: regular rate and rhythm Abdomen: soft, non-tender; no masses, no organomegaly Extremities: extremities normal, atraumatic, no cyanosis or edema Skin: skin color, texture, turgor normal. No rashes or lesions Lymph nodes: cervical, supraclavicular, and axillary nodes normal. Neurologic: grossly normal  Pelvic: External genitalia:  no lesions              No abnormal inguinal nodes palpated.              Urethra:  normal appearing urethra with no masses, tenderness or lesions              Bartholins and Skenes: normal                 Vagina: normal appearing vagina with normal color and discharge, no lesions              Cervix: no lesions              Pap taken: no Bimanual Exam:  Uterus:  normal size, contour, position, consistency, mobility, non-tender              Adnexa: no mass, fullness, tenderness              Rectal exam: yes.  Confirms.              Anus:  normal sphincter tone, no lesions  Chaperone was present for exam:  Estill Bamberg, CMA  Assessment:   Well woman visit with gynecologic exam. Status post endometrial ablation.  HRT.  Osteopenia.  PCP following.  HTN, elevated A1C, elevated cholesterol. Asthma.   Plan: Mammogram screening discussed. Self breast awareness reviewed. Pap and HR HPV 2025. Guidelines for Calcium, Vitamin D, regular exercise program including cardiovascular and weight bearing exercise. Discused WHI and use of HRT which can increase risk of PE, DVT, MI, stroke and breast cancer.  Will decrease to PremPro 0.3/1.5 mg daily.    She may contact me back if this dosage is not adequate for her.   She will start Vagifem tablets.  Follow up annually and prn.   After visit summary provided.

## 2022-07-06 ENCOUNTER — Encounter: Payer: Self-pay | Admitting: Obstetrics and Gynecology

## 2022-07-06 ENCOUNTER — Ambulatory Visit (INDEPENDENT_AMBULATORY_CARE_PROVIDER_SITE_OTHER): Payer: BC Managed Care – PPO | Admitting: Obstetrics and Gynecology

## 2022-07-06 VITALS — BP 104/58 | HR 84 | Ht 65.0 in | Wt 150.0 lb

## 2022-07-06 DIAGNOSIS — N952 Postmenopausal atrophic vaginitis: Secondary | ICD-10-CM

## 2022-07-06 DIAGNOSIS — Z79890 Hormone replacement therapy (postmenopausal): Secondary | ICD-10-CM | POA: Diagnosis not present

## 2022-07-06 DIAGNOSIS — Z01419 Encounter for gynecological examination (general) (routine) without abnormal findings: Secondary | ICD-10-CM | POA: Diagnosis not present

## 2022-07-06 MED ORDER — ESTRADIOL 10 MCG VA TABS: 1.0000 | ORAL_TABLET | VAGINAL | 3 refills | Status: DC

## 2022-07-06 MED ORDER — PREMPRO 0.3-1.5 MG PO TABS: 1.0000 | ORAL_TABLET | Freq: Every day | ORAL | 3 refills | Status: DC

## 2022-07-06 NOTE — Patient Instructions (Signed)

## 2022-11-30 ENCOUNTER — Ambulatory Visit: Payer: BC Managed Care – PPO | Admitting: Obstetrics and Gynecology

## 2022-11-30 ENCOUNTER — Other Ambulatory Visit: Payer: Self-pay | Admitting: Obstetrics and Gynecology

## 2022-11-30 ENCOUNTER — Encounter: Payer: Self-pay | Admitting: Obstetrics and Gynecology

## 2022-11-30 VITALS — BP 126/68 | HR 79 | Ht 65.0 in | Wt 146.0 lb

## 2022-11-30 DIAGNOSIS — R350 Frequency of micturition: Secondary | ICD-10-CM

## 2022-11-30 DIAGNOSIS — R3915 Urgency of urination: Secondary | ICD-10-CM

## 2022-11-30 DIAGNOSIS — R102 Pelvic and perineal pain: Secondary | ICD-10-CM

## 2022-11-30 LAB — WET PREP FOR TRICH, YEAST, CLUE

## 2022-11-30 MED ORDER — SULFAMETHOXAZOLE-TRIMETHOPRIM 800-160 MG PO TABS: 1.0000 | ORAL_TABLET | Freq: Two times a day (BID) | ORAL | 0 refills | Status: DC

## 2022-11-30 MED ORDER — PREMARIN 0.625 MG/GM VA CREA: TOPICAL_CREAM | VAGINAL | 0 refills | Status: DC

## 2022-11-30 NOTE — Progress Notes (Signed)
wetGYNECOLOGY  VISIT   HPI: 62 y.o.   Married  Caucasian  female   G2P2 with No LMP recorded. Patient has had an ablation.   here for   UTI or another issue related to vaginal dryness and pressure. Pt noticed a vaginal burning and used Vagifem on Sat. Pt felt discomfort still on Sunday and used expired premarin that night. Monday, took the Vagifem again and noticed worsening symptoms. Pt did not use any medication until last night. Used OTC AZO UTI test last night.  Feels tingling and burning in the vaginal area along with urinary frequency.  She feels a clamping sensation in the pelvis.  Using vagifem twice a week.  4 days ago, she felt symptoms.  3 nights ago she treated with 1/2 gram vaginal estrogen cream.  Used a vagifem the next morning.  She did an AZO test last night and had a small amount of leukocytes.  Prempro dosage was decreased in Sept. 2023 and vagifem was started.   No vaginal discharge.   Feels a slight twinge with urination.  No blood in the urine.  Some urgency to void.  No dysuria.  No use of new products.  Not sexually active.   Wearing leggings, which is new.   They are fitted.  No caffeine use.   GYNECOLOGIC HISTORY: No LMP recorded. Patient has had an ablation. Contraception:  ablation/ PMP Menopausal hormone therapy:  Prempro, vagifem. Last mammogram:  05/25/22 Breast Density Category C, BI-RADS CATEGORY 1 Neg Last pap smear:   06/25/20 neg: HR HPV neg        OB History     Gravida  2   Para  2   Term      Preterm      AB      Living  3      SAB      IAB      Ectopic      Multiple  1   Live Births                 Patient Active Problem List   Diagnosis Date Noted   Acute rhinosinusitis 02/08/2022   Laryngitis 02/08/2022   Hives 01/07/2021   Severe persistent asthma without complication [id number]   Seasonal allergies 01/07/2021   History of COVID-19 01/07/2021   Wheezing 10/08/2020   Allergic rhinitis 02/13/2020    Essential hypertension 07/20/2019   Mixed hyperlipidemia 07/20/2019   Paresthesia 07/20/2019   Exertional dyspnea 07/20/2019   Palpitations 07/18/2019   Severe persistent asthma with acute bronchitis and acute exacerbation 05/24/2011    Past Medical History:  Diagnosis Date   Allergic rhinitis    Asthma    Hiatal hernia 06/2020   History of COVID-19 06/2019   Hyperlipidemia    Hypertension    Osteopenia     Past Surgical History:  Procedure Laterality Date   CESAREAN SECTION  2000   ENDOMETRIAL ABLATION     NASAL SINUS SURGERY  1989    Current Outpatient Medications  Medication Sig Dispense Refill   albuterol (PROVENTIL) (2.5 MG/3ML) 0.083% nebulizer solution Take 3 mLs (2.5 mg total) by nebulization every 6 (six) hours as needed for wheezing or shortness of breath. 75 mL 11   albuterol (VENTOLIN HFA) 108 (90 Base) MCG/ACT inhaler Inhale 1 puff into the lungs as needed. 18 g 6   azelastine (OPTIVAR) 0.05 % ophthalmic solution Apply 1 drop to eye 2 (two) times daily.     clotrimazole-betamethasone (  LOTRISONE) cream Apply 1 application topically as needed.      EPINEPHrine 0.3 mg/0.3 mL IJ SOAJ injection Inject into the muscle as directed.     Estradiol (VAGIFEM) 10 MCG TABS vaginal tablet Place 1 tablet (10 mcg total) vaginally 2 (two) times a week. Use every night before bed for two weeks when you first begin this medicine, then after the first two weeks, begin using it twice a week. 34 tablet 3   estrogen, conjugated,-medroxyprogesterone (PREMPRO) 0.3-1.5 MG tablet Take 1 tablet by mouth daily. 90 tablet 3   fluorometholone (FML) 0.1 % ophthalmic suspension Place 1 drop into the left eye 4 (four) times daily.     fluticasone furoate-vilanterol (BREO ELLIPTA) 200-25 MCG/ACT AEPB Inhale 1 puff into the lungs daily. 90 each 3   montelukast (SINGULAIR) 10 MG tablet Take 1 tablet (10 mg total) by mouth at bedtime. 30 tablet 11   olmesartan-hydrochlorothiazide (BENICAR HCT) 40-12.5  MG tablet Take 1 tablet by mouth daily.     OVER THE COUNTER MEDICATION Hardin Negus colon health Takes 1 daily     rosuvastatin (CRESTOR) 10 MG tablet Take 10 mg by mouth 2 (two) times a week.     RYALTRIS [id number] MCG/ACT SUSP Place 2 sprays into both nostrils 2 (two) times daily.     Spacer/Aero-Holding Chambers (AEROCHAMBER MV) inhaler Use as instructed 1 each 0   desloratadine (CLARINEX) 5 MG tablet Take 1 tablet (5 mg total) by mouth daily. 90 tablet 3   DUPIXENT 300 MG/2ML SOPN INJECT 1 PEN UNDER THE SKIN EVERY 14 DAYS 16 mL 1   No current facility-administered medications for this visit.     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  Tobacco Use   Smoking status: Never   Smokeless tobacco: Never  Vaping Use   Vaping Use: Never used  Substance and Sexual Activity   Alcohol use: Not Currently   Drug use: No   Sexual activity: Not Currently    Birth control/protection: None    Comment: first intercourse>17, less than 5 partners, no DES exposure  Other Topics Concern   Not on file  Social History Narrative   Not on file   Social Determinants of Health   Financial Resource Strain: Not on file  Food Insecurity: Not on file  Transportation Needs: Not on file  Physical Activity: Not on file  Stress: Not on file  Social Connections: Not on file  Intimate Partner Violence: Not on file    Review of Systems   Genitourinary:  Positive for dysuria, urgency and vaginal pain.    PHYSICAL EXAMINATION:    BP 126/68 (BP Location: Left Arm, Patient Position: Sitting, Cuff Size: Normal)   Pulse 79   Ht 5' 5"$  (1.651 m)   Wt 146 lb (66.2 kg)   SpO2 96%  BMI 24.30 kg/m     General appearance: alert, cooperative and appears stated age  Pelvic: External genitalia:  no lesions              Urethra:  normal appearing urethra with no masses, tenderness or lesions              Bartholins and Skenes: normal                 Vagina: normal appearing vagina with normal color and discharge, no lesions              Cervix: no lesions                Bimanual Exam:  Uterus:  normal size, contour, position, consistency, mobility, non-tender              Adnexa: no mass, fullness, tenderness        Chaperone was present for exam:  Raquel Sarna  ASSESSMENT  Vaginal pain.  Urinary urgency and frequency.  Vaginal atrophy.   PLAN  Wet prep:  neg for all yeast, clue cells, and trichomonas. Urinalysis:  sg 1.003, ph 6.5, 0 - 5 WBC, 0 - 2 RBC, 6 - 10 squams, few bacteria, few mucus, NS crystals, NS casts, NS yeast. UC sent.  Will treat for cystitis:  bactrim DS po bid x 3 days.  Ok to use OTC AZO 100 mg po tid x 2 days. Hydrate well.  Call the office if no improvement in 48 hours.  Rx for Premarin cream, pea size amount to the urethra twice weekly for UTI prevention. FU prn.   An After Visit Summary was printed and given to the patient.  26 min  total time was spent for this patient encounter, including preparation, face-to-face counseling with the patient, coordination of care, and documentation of the encounter.

## 2022-12-02 ENCOUNTER — Other Ambulatory Visit: Payer: Self-pay | Admitting: Obstetrics and Gynecology

## 2022-12-02 LAB — URINALYSIS, COMPLETE W/RFL CULTURE
Bilirubin Urine: NEGATIVE
Glucose, UA: NEGATIVE
Hyaline Cast: NONE SEEN /LPF
Ketones, ur: NEGATIVE
Leukocyte Esterase: NEGATIVE
Nitrites, Initial: NEGATIVE
Protein, ur: NEGATIVE
Specific Gravity, Urine: 1.003 (ref 1.001–1.035)
pH: 6.5 (ref 5.0–8.0)

## 2022-12-02 LAB — URINE CULTURE
MICRO NUMBER:: 14594842
Result:: NO GROWTH
SPECIMEN QUALITY:: ADEQUATE

## 2022-12-02 LAB — CULTURE INDICATED

## 2022-12-06 NOTE — Telephone Encounter (Signed)
Last AEX 07/06/2022--scheduled for 07/13/2023. Last mammo 05/25/2022-neg birads 1  Rxd estrace cream on 2.21.2024 for UTI prevention. Will refuse this rx for premarin cream for now.  Will route to provider for final review and close.

## 2022-12-16 ENCOUNTER — Encounter: Payer: Self-pay | Admitting: Pulmonary Disease

## 2022-12-16 ENCOUNTER — Other Ambulatory Visit: Payer: Self-pay | Admitting: Pulmonary Disease

## 2022-12-16 ENCOUNTER — Ambulatory Visit: Payer: BC Managed Care – PPO | Admitting: Pulmonary Disease

## 2022-12-16 DIAGNOSIS — J8283 Eosinophilic asthma: Secondary | ICD-10-CM | POA: Diagnosis not present

## 2022-12-16 DIAGNOSIS — J4550 Severe persistent asthma, uncomplicated: Secondary | ICD-10-CM | POA: Diagnosis not present

## 2022-12-16 MED ORDER — MONTELUKAST SODIUM 10 MG PO TABS: 10.0000 mg | ORAL_TABLET | Freq: Every day | ORAL | 3 refills | Status: DC

## 2022-12-16 MED ORDER — ALBUTEROL SULFATE HFA 108 (90 BASE) MCG/ACT IN AERS: 1.0000 | INHALATION_SPRAY | RESPIRATORY_TRACT | 6 refills | Status: DC | PRN

## 2022-12-16 MED ORDER — FLUTICASONE FUROATE-VILANTEROL 200-25 MCG/ACT IN AEPB: 1.0000 | INHALATION_SPRAY | Freq: Every day | RESPIRATORY_TRACT | 3 refills | Status: DC

## 2022-12-16 MED ORDER — DUPIXENT 300 MG/2ML ~~LOC~~ SOAJ: SUBCUTANEOUS | 3 refills | Status: DC

## 2022-12-16 MED ORDER — ALBUTEROL SULFATE (2.5 MG/3ML) 0.083% IN NEBU: 2.5000 mg | INHALATION_SOLUTION | Freq: Four times a day (QID) | RESPIRATORY_TRACT | 11 refills | Status: DC | PRN

## 2022-12-16 MED ORDER — DESLORATADINE 5 MG PO TABS: 5.0000 mg | ORAL_TABLET | Freq: Every day | ORAL | 3 refills | Status: DC

## 2022-12-16 NOTE — Patient Instructions (Signed)
Thank you for visiting Dr. Valeta Harms at [facility]. Today we recommend the following:  Meds ordered this encounter  Medications   albuterol (PROVENTIL) (2.5 MG/3ML) 0.083% nebulizer solution    Sig: Take 3 mLs (2.5 mg total) by nebulization every 6 (six) hours as needed for wheezing or shortness of breath.    Dispense:  75 mL    Refill:  11    Dx: J45.50   albuterol (VENTOLIN HFA) 108 (90 Base) MCG/ACT inhaler    Sig: Inhale 1 puff into the lungs as needed.    Dispense:  18 g    Refill:  6   desloratadine (CLARINEX) 5 MG tablet    Sig: Take 1 tablet (5 mg total) by mouth daily.    Dispense:  90 tablet    Refill:  3   Dupilumab (DUPIXENT) 300 MG/2ML SOPN    Sig: INJECT 1 PEN UNDER THE SKIN EVERY 14 DAYS    Dispense:  16 mL    Refill:  3   fluticasone furoate-vilanterol (BREO ELLIPTA) 200-25 MCG/ACT AEPB    Sig: Inhale 1 puff into the lungs daily.    Dispense:  90 each    Refill:  3   montelukast (SINGULAIR) 10 MG tablet    Sig: Take 1 tablet (10 mg total) by mouth at bedtime.    Dispense:  90 tablet    Refill:  3   Return in about 1 year (around 12/16/2023) for with APP or Dr. Valeta Harms.    Please do your part to reduce the spread of COVID-19.

## 2022-12-16 NOTE — Progress Notes (Unsigned)
Synopsis: Referred in March 2021 for asthma by [name], MD  Subjective:   PATIENT ID: Carol Acosta GENDER: female DOB: 16-Sep-1961, MRN: [id number]  No chief complaint on file.   This is a 62 year old female past medical history of asthma, hypertension hyperlipidemia, allergic rhinitis.  Patient presents to the office for evaluation of asthma. She has had asthma since child. She has had allergy skin testing with multiple positivities. She had September covid mild symptoms, dx sept 3rd. She always gets flare ups in aug, September, again with issues in the spring time. She has seen cardiology for management of HTN and HLD. She retired Nov 1st. Her anxiety is much better down. She still having recurrent flares. She is married, twin 83 years olds, and 61 year old. Currently managed with Breo 200, was on prednisone three times this past year.   OV 01/01/2020: Patient with ongoing respiratory symptoms.  Very anxious today.  She has been trying to work out and start trying to take care of herself.  We talked about various other medications that she has been using.  She uses antihistamines on occasion.  She has been on Singulair in the past but not currently.  Seldom uses her nebulizer or albuterol.  Even though she does state that she probably should when she has symptoms but she does not always reach to use her albuterol.    OV 10/08/2020: Here today for follow-up history of asthma. Patient last seen in the office September 2021 by Patricia Nettle, NP patient was put on a prednisone taper recommend to continue use of Breo.  Today doing well today.  Had a slight flare over the Christmas holiday of able to maintain with just use of as needed albuterol she is religiously using her antihistamines and Singulair.  We reviewed her previous lab work approximately 9 months ago which included a Copywriter, advertising and CBC which had a absolute eosinophil count of 200 and IgE of 91 and positive environmental  allergens.  OV 12/24/2021: Here today for follow-up.  Had some issues regarding obtaining Breo and insurance wanting to change medications.  However after starting Dupixent this is changed her life.  She has only had 1 exacerbation this past year.  In 1 appointment for a office visit regarding upper respiratory tract symptoms which she normally had multiple per year.  She is back on Breo 200 and doing well with this.  She needs refills of all of her allergy medications, inhalers and albuterol.  OV 05/26/2022: Here today for follow-up regarding severe persistent asthma symptoms.  Doing well on Dupixent.  Currently managed with Breo 200.Last seen in the office by Roxan Diesel, NP on 02/22/2022.  Taking Astelin, Flonase nasal spray, Clarinex plus Singulair.  Compliant with Dupixent injections.  From respiratory standpoint doing fine.  Feels better after her exacerbation in the beginning of May.   Past Medical History:  Diagnosis Date   Allergic rhinitis    Asthma    Hiatal hernia 06/2020   History of COVID-19 06/2019   Hyperlipidemia    Hypertension    Osteopenia      Family History  Problem Relation Age of Onset   Asthma Mother    Allergies Mother    Hypertension Mother    Thyroid disease Mother    COPD Maternal Grandmother    Hypertension Maternal Grandmother    Allergic rhinitis Brother    Dementia Brother    Allergic rhinitis Brother    Breast cancer Neg Hx  Past Surgical History:  Procedure Laterality Date   CESAREAN SECTION  2000   ENDOMETRIAL ABLATION     NASAL SINUS SURGERY  1989    Social History   Socioeconomic History   Marital status: Married    Spouse name: Not on file   Number of children: 3   Years of education: Not on file   Highest education level: Not on file  Occupational History   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  Tobacco Use   Smoking status: Never   Smokeless tobacco: Never  Vaping Use   Vaping Use: Never used  Substance and Sexual Activity    Alcohol use: Not Currently   Drug use: No   Sexual activity: Not Currently    Birth control/protection: None    Comment: first intercourse>17, less than 5 partners, no DES exposure  Other Topics Concern   Not on file  Social History Narrative   Not on file   Social Determinants of Health   Financial Resource Strain: Not on file  Food Insecurity: Not on file  Transportation Needs: Not on file  Physical Activity: Not on file  Stress: Not on file  Social Connections: Not on file  Intimate Partner Violence: Not on file     Allergies  Allergen Reactions   Advair Diskus [Fluticasone-Salmeterol] Itching and Anxiety    Patient states that she felt like she was going crawl out of her skin.     Outpatient Medications Prior to Visit  Medication Sig Dispense Refill   albuterol (PROVENTIL) (2.5 MG/3ML) 0.083% nebulizer solution Take 3 mLs (2.5 mg total) by nebulization every 6 (six) hours as needed for wheezing or shortness of breath. 75 mL 11   albuterol (VENTOLIN HFA) 108 (90 Base) MCG/ACT inhaler Inhale 1 puff into the lungs as needed. 18 g 6   azelastine (OPTIVAR) 0.05 % ophthalmic solution Apply 1 drop to eye 2 (two) times daily.     clotrimazole-betamethasone (LOTRISONE) cream Apply 1 application topically as needed.      desloratadine (CLARINEX) 5 MG tablet Take 1 tablet (5 mg total) by mouth daily. 90 tablet 3   DUPIXENT 300 MG/2ML SOPN INJECT 1 PEN UNDER THE SKIN EVERY 14 DAYS 16 mL 1   EPINEPHrine 0.3 mg/0.3 mL IJ SOAJ injection Inject into the muscle as directed.     estradiol (ESTRACE VAGINAL) 0.1 MG/GM vaginal cream APPLY PEA-SIZE AMOUNT TO URETHRA TWICE A WEEK AT BEDTIME 42.5 g 0   Estradiol (VAGIFEM) 10 MCG TABS vaginal tablet Place 1 tablet (10 mcg total) vaginally 2 (two) times a week. Use every night before bed for two weeks when you first begin this medicine, then after the first two weeks, begin using it twice a week. 34 tablet 3   estrogen,  conjugated,-medroxyprogesterone (PREMPRO) 0.3-1.5 MG tablet Take 1 tablet by mouth daily. 90 tablet 3   fluorometholone (FML) 0.1 % ophthalmic suspension Place 1 drop into the left eye 4 (four) times daily.     fluticasone furoate-vilanterol (BREO ELLIPTA) 200-25 MCG/ACT AEPB Inhale 1 puff into the lungs daily. 90 each 3   montelukast (SINGULAIR) 10 MG tablet Take 1 tablet (10 mg total) by mouth at bedtime. 30 tablet 11   olmesartan-hydrochlorothiazide (BENICAR HCT) 40-12.5 MG tablet Take 1 tablet by mouth daily.     OVER THE COUNTER MEDICATION Hardin Negus colon health Takes 1 daily     rosuvastatin (CRESTOR) 10 MG tablet Take 10 mg by mouth 2 (two) times a week.     RYALTRIS  [id number] MCG/ACT SUSP Place 2 sprays into both nostrils 2 (two) times daily.     Spacer/Aero-Holding Chambers (AEROCHAMBER MV) inhaler Use as instructed 1 each 0   sulfamethoxazole-trimethoprim (BACTRIM DS) 800-160 MG tablet Take 1 tablet by mouth 2 (two) times daily. One PO BID x 3 days 6 tablet 0   No facility-administered medications prior to visit.    Review of Systems  Constitutional:  Negative for chills, fever, malaise/fatigue and weight loss.  HENT:  Negative for hearing loss, sore throat and tinnitus.   Eyes:  Negative for blurred vision and double vision.  Respiratory:  Positive for cough. Negative for hemoptysis, sputum production, shortness of breath, wheezing and stridor.   Cardiovascular:  Negative for chest pain, palpitations, orthopnea, leg swelling and PND.  Gastrointestinal:  Negative for abdominal pain, constipation, diarrhea, heartburn, nausea and vomiting.  Genitourinary:  Negative for dysuria, hematuria and urgency.  Musculoskeletal:  Negative for joint pain and myalgias.  Skin:  Negative for itching and rash.  Neurological:  Negative for dizziness, tingling, weakness and headaches.  Endo/Heme/Allergies:  Negative for environmental allergies. Does not bruise/bleed easily.  Psychiatric/Behavioral:   Negative for depression. The patient is not nervous/anxious and does not have insomnia.   All other systems reviewed and are negative.    Objective:  Physical Exam Vitals reviewed.  Constitutional:      General: She is not in acute distress.    Appearance: She is well-developed.  HENT:     Head: Normocephalic and atraumatic.  Eyes:     General: No scleral icterus.    Conjunctiva/sclera: Conjunctivae normal.     Pupils: Pupils are equal, round, and reactive to light.  Neck:     Vascular: No JVD.     Trachea: No tracheal deviation.  Cardiovascular:     Rate and Rhythm: Normal rate and regular rhythm.     Heart sounds: Normal heart sounds. No murmur heard. Pulmonary:     Effort: Pulmonary effort is normal. No tachypnea, accessory muscle usage or respiratory distress.     Breath sounds: No stridor. No wheezing, rhonchi or rales.  Abdominal:     General: There is no distension.     Palpations: Abdomen is soft.     Tenderness: There is no abdominal tenderness.  Musculoskeletal:        General: No tenderness.     Cervical back: Neck supple.  Lymphadenopathy:     Cervical: No cervical adenopathy.  Skin:    General: Skin is warm and dry.     Capillary Refill: Capillary refill takes less than 2 seconds.     Findings: No rash.  Neurological:     Mental Status: She is alert and oriented to person, place, and time.  Psychiatric:        Behavior: Behavior normal.     There were no vitals filed for this visit.    on RA BMI Readings from Last 3 Encounters:  11/30/22 24.30 kg/m  07/06/22 24.96 kg/m  05/26/22 25.89 kg/m   Wt Readings from Last 3 Encounters:  11/30/22 146 lb (66.2 kg)  07/06/22 150 lb (68 kg)  05/26/22 155 lb 9.6 oz (70.6 kg)     CBC    Component Value Date/Time   WBC 14.0 (H) 02/09/2022 0933   RBC 5.01 02/09/2022 0933   HGB 14.4 02/09/2022 0933   HCT 43.5 02/09/2022 0933   PLT 283.0 02/09/2022 0933   MCV 86.8 02/09/2022 0933   MCHC 33.2  02/09/2022 0933  RDW 13.0 02/09/2022 0933   LYMPHSABS 3.4 02/09/2022 0933   MONOABS 0.9 02/09/2022 0933   EOSABS 0.0 02/09/2022 0933   BASOSABS 0.0 02/09/2022 0933     Chest Imaging: 05/24/2011 chest x-ray: Evidence of hyperinflation no infiltrate.  Pulmonary Functions Testing Results:    Latest Ref Rng & Units 02/12/2020    9:58 AM  PFT Results  FVC-Pre L 3.37  P  FVC-Predicted Pre % 95  P  FVC-Post L 3.39  P  FVC-Predicted Post % 95  P  Pre FEV1/FVC % % 73  P  Post FEV1/FCV % % 74  P  FEV1-Pre L 2.47  P  FEV1-Predicted Pre % 89  P  FEV1-Post L 2.51  P  DLCO uncorrected ml/min/mmHg 25.74  P  DLCO UNC% % 120  P  DLCO corrected ml/min/mmHg 24.25  P  DLCO COR %Predicted % 113  P  DLVA Predicted % 115  P  TLC L 5.51  P  TLC % Predicted % 104  P  RV % Predicted % 104  P    P Preliminary result     FeNO: None  Pathology: None   Echocardiogram:  08/05/2019: Normal ejection fraction 65 to [id number], grade 1 diastolic dysfunction.  Heart Catheterization: None     Assessment & Plan:   No diagnosis found.    Discussion:  This is a 62 year old female, past medical history of asthma childhood, recurrent exacerbations, steroid use several times a year, history of COVID-19, past positive RAST panel, elevated eosinophil count of 200.  Breakthrough symptoms despite Breo.  Was started on Dupixent over a year ago.  Has been doing really well on Dupixent injections.  Also using nasal spray and antihistamines.  Plan: Continue Breo 200 New prescription for 90 days and 1 year prescription. Continue Singulair and nasal spray. Continue allergy shots with her allergist. Continue Dupixent.  Follow-up with [place] in 1 year or as needed.   Current Outpatient Medications:    albuterol (PROVENTIL) (2.5 MG/3ML) 0.083% nebulizer solution, Take 3 mLs (2.5 mg total) by nebulization every 6 (six) hours as needed for wheezing or shortness of breath., Disp: 75 mL, Rfl: 11   albuterol (VENTOLIN  HFA) 108 (90 Base) MCG/ACT inhaler, Inhale 1 puff into the lungs as needed., Disp: 18 g, Rfl: 6   azelastine (OPTIVAR) 0.05 % ophthalmic solution, Apply 1 drop to eye 2 (two) times daily., Disp: , Rfl:    clotrimazole-betamethasone (LOTRISONE) cream, Apply 1 application topically as needed. , Disp: , Rfl:    desloratadine (CLARINEX) 5 MG tablet, Take 1 tablet (5 mg total) by mouth daily., Disp: 90 tablet, Rfl: 3   DUPIXENT 300 MG/2ML SOPN, INJECT 1 PEN UNDER THE SKIN EVERY 14 DAYS, Disp: 16 mL, Rfl: 1   EPINEPHrine 0.3 mg/0.3 mL IJ SOAJ injection, Inject into the muscle as directed., Disp: , Rfl:    estradiol (ESTRACE VAGINAL) 0.1 MG/GM vaginal cream, APPLY PEA-SIZE AMOUNT TO URETHRA TWICE A WEEK AT BEDTIME, Disp: 42.5 g, Rfl: 0   Estradiol (VAGIFEM) 10 MCG TABS vaginal tablet, Place 1 tablet (10 mcg total) vaginally 2 (two) times a week. Use every night before bed for two weeks when you first begin this medicine, then after the first two weeks, begin using it twice a week., Disp: 34 tablet, Rfl: 3   estrogen, conjugated,-medroxyprogesterone (PREMPRO) 0.3-1.5 MG tablet, Take 1 tablet by mouth daily., Disp: 90 tablet, Rfl: 3   fluorometholone (FML) 0.1 % ophthalmic suspension, Place 1 drop into  the left eye 4 (four) times daily., Disp: , Rfl:    fluticasone furoate-vilanterol (BREO ELLIPTA) 200-25 MCG/ACT AEPB, Inhale 1 puff into the lungs daily., Disp: 90 each, Rfl: 3   montelukast (SINGULAIR) 10 MG tablet, Take 1 tablet (10 mg total) by mouth at bedtime., Disp: 30 tablet, Rfl: 11   olmesartan-hydrochlorothiazide (BENICAR HCT) 40-12.5 MG tablet, Take 1 tablet by mouth daily., Disp: , Rfl:    OVER THE COUNTER MEDICATION, Phillips colon health Takes 1 daily, Disp: , Rfl:    rosuvastatin (CRESTOR) 10 MG tablet, Take 10 mg by mouth 2 (two) times a week., Disp: , Rfl:    RYALTRIS 665-25 MCG/ACT SUSP, Place 2 sprays into both nostrils 2 (two) times daily., Disp: , Rfl:    Spacer/Aero-Holding Chambers  (AEROCHAMBER MV) inhaler, Use as instructed, Disp: 1 each, Rfl: 0   sulfamethoxazole-trimethoprim (BACTRIM DS) 800-160 MG tablet, Take 1 tablet by mouth 2 (two) times daily. One PO BID x 3 days, Disp: 6 tablet, Rfl: 0  I spent 40 minutes dedicated to the care of this patient on the date of this encounter to include pre-visit review of records, face-to-face time with the patient discussing conditions above, post visit ordering of testing, clinical documentation with the electronic health record, making appropriate referrals as documented, and communicating necessary findings to members of the patients care team.    Garner Nash, DO [facility] 12/16/2022 2:25 PM

## 2022-12-19 ENCOUNTER — Other Ambulatory Visit: Payer: Self-pay | Admitting: Pulmonary Disease

## 2022-12-19 DIAGNOSIS — J4550 Severe persistent asthma, uncomplicated: Secondary | ICD-10-CM

## 2022-12-19 DIAGNOSIS — J8283 Eosinophilic asthma: Secondary | ICD-10-CM

## 2022-12-21 ENCOUNTER — Other Ambulatory Visit: Payer: Self-pay | Admitting: Pulmonary Disease

## 2022-12-21 DIAGNOSIS — J8283 Eosinophilic asthma: Secondary | ICD-10-CM

## 2022-12-21 DIAGNOSIS — J4550 Severe persistent asthma, uncomplicated: Secondary | ICD-10-CM

## 2022-12-22 NOTE — Telephone Encounter (Signed)
Refill sent for [place] to [facility]: [phone]  Dose: 300 mg SQ every 3 weeks  Last OV: 12/16/22 Provider: Dr. Valeta Harms  Next OV: 1 year (not yet scheduled)  Knox Saliva, PharmD, MPH, BCPS Clinical Pharmacist (Rheumatology and Pulmonology)

## 2023-01-13 ENCOUNTER — Telehealth: Payer: Self-pay | Admitting: Pulmonary Disease

## 2023-01-13 ENCOUNTER — Ambulatory Visit: Payer: BC Managed Care – PPO | Admitting: Adult Health

## 2023-01-13 NOTE — Telephone Encounter (Signed)
Called and spoke with patient. She stated that she has not feeling well for the past few days. She was outside at a lacrosse game on Wednesday. She has been coughing up thick green phlegm ever since as well as wheezing. She had a low grade fever that started yesterday around lunch time and has been alternating between ibuprofen and Tylenol.   She confirmed that she has been using her albuterol nebs as needed as well as her Breo 1 daily.  She wanted to know if Dr. Tonia Brooms could send in something for her.   Pharmacy is [facility].   Dr. Tonia Brooms, can you please advise? Thanks!

## 2023-01-13 NOTE — Telephone Encounter (Signed)
Spoke with patient . She states she didn't want to wait for a call back. She called tele-doc and was sent in antibiotic and prednisone. NFN

## 2023-01-13 NOTE — Telephone Encounter (Signed)
Carol Igo, DO  Potts, Cherina M, CMA1 hour ago (12:21 PM)    Start pred taper 40mg X3 days, decreased by 10mg  every 3 days  Thanks  BLI    Pt called office. Let her know that Dr. Tonia Brooms said to send pred taper to the pharmacy and she verbalized understanding.   Pt also said that she needs to have an abx sent in as well as the prednisone as she is coughing up a lot of green phlegm. Dr. Tonia Brooms, please advise on this.

## 2023-01-13 NOTE — Telephone Encounter (Signed)
PT feels like she has a Upper Resp infection. No appts avail. Please call to advise.  Pharm is [facility].

## 2023-03-15 ENCOUNTER — Telehealth: Payer: Self-pay

## 2023-03-15 NOTE — Telephone Encounter (Signed)
Received fax from [facility] stating that they have "reached out to [place] to obtain clinical information" (which in this case means they have faxed our office the PA renewal form at some point within the last month) however the pharmacy team has not received that form. In order to ensure no interruptions in therapy I will attempt to submit a PA via CMM.  Submitted a Prior Authorization request to [facility] for DUPIXENT via CoverMyMeds. Will update once we receive a response.  Key:  [id number]

## 2023-03-16 NOTE — Telephone Encounter (Signed)
Received notification from [facility] regarding a prior authorization for DUPIXENT. Authorization has been APPROVED from 03/15/2023 to 03/14/2024. Approval letter sent to scan center.  Authorization # [id number]

## 2023-04-12 ENCOUNTER — Other Ambulatory Visit: Payer: Self-pay | Admitting: Internal Medicine

## 2023-04-12 DIAGNOSIS — Z1231 Encounter for screening mammogram for malignant neoplasm of breast: Secondary | ICD-10-CM

## 2023-04-25 ENCOUNTER — Other Ambulatory Visit: Payer: Self-pay | Admitting: Gastroenterology

## 2023-04-25 DIAGNOSIS — R1011 Right upper quadrant pain: Secondary | ICD-10-CM

## 2023-04-28 ENCOUNTER — Other Ambulatory Visit (HOSPITAL_COMMUNITY): Payer: Self-pay

## 2023-04-28 ENCOUNTER — Telehealth: Payer: Self-pay | Admitting: Pulmonary Disease

## 2023-04-28 ENCOUNTER — Other Ambulatory Visit: Payer: Self-pay | Admitting: *Deleted

## 2023-04-28 MED ORDER — NIRMATRELVIR/RITONAVIR (PAXLOVID)TABLET
3.0000 | ORAL_TABLET | Freq: Two times a day (BID) | ORAL | 0 refills | Status: AC
  Filled 2023-04-28: qty 30, 5d supply, fill #0

## 2023-04-28 MED ORDER — NIRMATRELVIR/RITONAVIR (PAXLOVID)TABLET: 3.0000 | ORAL_TABLET | Freq: Two times a day (BID) | ORAL | 0 refills | Status: DC

## 2023-04-28 NOTE — Telephone Encounter (Signed)
Called and spoke with patient. Dr. Myrlene Broker recommendations given.  Understanding stated.  Paxlovid prescription sent to requested [facility].  Nothing further at this time.

## 2023-04-28 NOTE — Telephone Encounter (Signed)
Called and spoke with patient.  Patient stated she wasn't feeling well yesterday morning and tested for covid. Test yesterday morning was negative.  Patient stated by 5pm she was feeling head congestion, had a sore throat, headache and chills.  Patient tested again for covid and was positive.  Patient stated at this time she has no asthma issues or flares, but wanted to be proactive.  Patient request any prescriptions be sent to [facility].  Message routed to Dr. Tonia Brooms

## 2023-04-28 NOTE — Telephone Encounter (Signed)
Related to computer issues Paxlovid was called to [facility].  Bryson Dames is currently out until Monday 05/01/23. Called to [facility] outpatient pharmacy and at this time they do have Paxlovid.  Paxlovid prescription sent and patient is aware.  Patient is also aware insurance may not cover or she may have a high co pay. Nothing further at this time.

## 2023-04-28 NOTE — Telephone Encounter (Signed)
Pt. Calling in tested positive for covid and wanted to ask if Dr. Tonia Brooms could write a script for her

## 2023-04-28 NOTE — Addendum Note (Signed)
Addended by: Jacquiline Doe on: 04/28/2023 11:17 AM   Modules accepted: Orders

## 2023-05-01 ENCOUNTER — Other Ambulatory Visit: Payer: BC Managed Care – PPO

## 2023-05-10 ENCOUNTER — Ambulatory Visit: Admission: RE | Admit: 2023-05-10 | Payer: BC Managed Care – PPO | Source: Ambulatory Visit

## 2023-05-10 DIAGNOSIS — R1011 Right upper quadrant pain: Secondary | ICD-10-CM

## 2023-05-29 ENCOUNTER — Ambulatory Visit: Payer: BC Managed Care – PPO

## 2023-05-29 ENCOUNTER — Ambulatory Visit
Admission: RE | Admit: 2023-05-29 | Discharge: 2023-05-29 | Disposition: A | Discharge status: Home / Self Care | Payer: BC Managed Care – PPO | Source: Ambulatory Visit | Attending: Internal Medicine | Admitting: Internal Medicine

## 2023-05-29 DIAGNOSIS — Z1231 Encounter for screening mammogram for malignant neoplasm of breast: Secondary | ICD-10-CM

## 2023-06-05 ENCOUNTER — Telehealth: Payer: Self-pay | Admitting: Pulmonary Disease

## 2023-06-05 NOTE — Telephone Encounter (Signed)
OV notes and clearance form have been faxed back to [facility]. Nothing further needed at this time.  

## 2023-06-05 NOTE — Telephone Encounter (Signed)
Fax received from Dr. Melody Haver with [facility] to perform a Lap Chole on patient.  Patient needs surgery clearance. Surgery is Pending. Patient was seen on 12/16/22. Office protocol is a risk assessment can be sent to surgeon if patient has been seen in 60 days or less.   Sending to Dr. Tonia Brooms for risk assessment or recommendations if patient needs to be seen in office prior to surgical procedure.    Patient has OV scheduled for 06/08/23

## 2023-06-08 ENCOUNTER — Ambulatory Visit: Payer: BC Managed Care – PPO | Admitting: Pulmonary Disease

## 2023-06-08 NOTE — Progress Notes (Signed)
Surgery orders requested via Epic inbox. °

## 2023-06-08 NOTE — Patient Instructions (Addendum)
SURGICAL WAITING ROOM VISITATION Patients having surgery or a procedure may have no more than 2 support people in the waiting area - these visitors may rotate.    Children under the age of 65 must have an adult with them who is not the patient.  If the patient needs to stay at the hospital during part of their recovery, the visitor guidelines for inpatient rooms apply. Pre-op nurse will coordinate an appropriate time for 1 support person to accompany patient in pre-op.  This support person may not rotate.    Please refer to the [facility] website for the visitor guidelines for Inpatients (after your surgery is over and you are in a regular room).       Your procedure is scheduled on: 06-23-23   Report to [facility] Main Entrance    Report to admitting at 7:15 AM   Call this number if you have problems the morning of surgery [phone]   Do not eat food :After Midnight.   After Midnight you may have the following liquids until 6:30 AM DAY OF SURGERY  Water Non-Citrus Juices (without pulp, NO RED-Apple, White grape, White cranberry) Black Coffee (NO MILK/CREAM OR CREAMERS, sugar ok)  Clear Tea (NO MILK/CREAM OR CREAMERS, sugar ok) regular and decaf                             Plain Jell-O (NO RED)                                           Fruit ices (not with fruit pulp, NO RED)                                     Popsicles (NO RED)                                                               Sports drinks like Gatorade (NO RED)                       If you have questions, please contact your surgeon's office.  FOLLOW  ANY ADDITIONAL PRE OP INSTRUCTIONS YOU RECEIVED FROM YOUR SURGEON'S OFFICE!!!   Oral Hygiene is also important to reduce your risk of infection.                                    Remember - BRUSH YOUR TEETH THE MORNING OF SURGERY WITH YOUR REGULAR TOOTHPASTE   Do NOT smoke after Midnight   Take these medicines the morning of surgery with A SIP OF  WATER:   Clarinex  Okay to use inhalers, nasal spray and eyedrops  Stop all vitamins and herbal supplements 7 days before surgery  Bring CPAP mask and tubing day of surgery.                              You may not  have any metal on your body including hair pins, jewelry, and body piercing             Do not wear make-up, lotions, powders, perfumes or deodorant  Do not wear nail polish including gel and S&S, artificial/acrylic nails, or any other type of covering on natural nails including finger and toenails. If you have artificial nails, gel coating, etc. that needs to be removed by a nail salon please have this removed prior to surgery or surgery may need to be canceled/ delayed if the surgeon/ anesthesia feels like they are unable to be safely monitored.   Do not shave  48 hours prior to surgery.    Do not bring valuables to the hospital. [place] IS NOT RESPONSIBLE   FOR VALUABLES.   Contacts, dentures or bridgework may not be worn into surgery.  DO NOT BRING YOUR HOME MEDICATIONS TO THE HOSPITAL. PHARMACY WILL DISPENSE MEDICATIONS LISTED ON YOUR MEDICATION LIST TO YOU DURING YOUR ADMISSION IN THE HOSPITAL!    Patients discharged on the day of surgery will not be allowed to drive home.  Someone NEEDS to stay with you for the first 24 hours after anesthesia.   Special Instructions: Bring a copy of your healthcare power of attorney and living will documents the day of surgery if you haven't scanned them before.              Please read over the following fact sheets you were given: IF YOU HAVE QUESTIONS ABOUT YOUR PRE-OP INSTRUCTIONS PLEASE CALL [phone]  If you received a COVID test during your pre-op visit  it is requested that you wear a mask when out in public, stay away from anyone that may not be feeling well and notify your surgeon if you develop symptoms. If you test positive for Covid or have been in contact with anyone that has tested positive in the last 10 days  please notify you surgeon.   - Preparing for Surgery Before surgery, you can play an important role.  Because skin is not sterile, your skin needs to be as free of germs as possible.  You can reduce the number of germs on your skin by washing with CHG (chlorahexidine gluconate) soap before surgery.  CHG is an antiseptic cleaner which kills germs and bonds with the skin to continue killing germs even after washing. Please DO NOT use if you have an allergy to CHG or antibacterial soaps.  If your skin becomes reddened/irritated stop using the CHG and inform your nurse when you arrive at Short Stay. Do not shave (including legs and underarms) for at least 48 hours prior to the first CHG shower.  You may shave your face/neck.  Please follow these instructions carefully:  1.  Shower with CHG Soap the night before surgery and the  morning of surgery.  2.  If you choose to wash your hair, wash your hair first as usual with your normal  shampoo.  3.  After you shampoo, rinse your hair and body thoroughly to remove the shampoo.                             4.  Use CHG as you would any other liquid soap.  You can apply chg directly to the skin and wash.  Gently with a scrungie or clean washcloth.  5.  Apply the CHG Soap to your body ONLY FROM THE NECK DOWN.  Do   not use on face/ open                           Wound or open sores. Avoid contact with eyes, ears mouth and   genitals (private parts).                       Wash face,  Genitals (private parts) with your normal soap.             6.  Wash thoroughly, paying special attention to the area where your    surgery  will be performed.  7.  Thoroughly rinse your body with warm water from the neck down.  8.  DO NOT shower/wash with your normal soap after using and rinsing off the CHG Soap.                9.  Pat yourself dry with a clean towel.            10.  Wear clean pajamas.            11.  Place clean sheets on your bed the night of your  first shower and do not  sleep with pets. Day of Surgery : Do not apply any lotions/deodorants the morning of surgery.  Please wear clean clothes to the hospital/surgery center.  FAILURE TO FOLLOW THESE INSTRUCTIONS MAY RESULT IN THE CANCELLATION OF YOUR SURGERY  PATIENT SIGNATURE_________________________________  NURSE SIGNATURE__________________________________  ________________________________________________________________________

## 2023-06-09 ENCOUNTER — Encounter (HOSPITAL_COMMUNITY): Payer: Self-pay

## 2023-06-09 ENCOUNTER — Other Ambulatory Visit: Payer: Self-pay

## 2023-06-09 ENCOUNTER — Encounter (HOSPITAL_COMMUNITY)
Admission: RE | Admit: 2023-06-09 | Discharge: 2023-06-09 | Disposition: A | Discharge status: Home / Self Care | Payer: BC Managed Care – PPO | Source: Ambulatory Visit | Attending: General Surgery | Admitting: General Surgery

## 2023-06-09 VITALS — BP 114/67 | HR 65 | Temp 98.0°F | Ht 66.0 in | Wt 143.0 lb

## 2023-06-09 DIAGNOSIS — I10 Essential (primary) hypertension: Secondary | ICD-10-CM | POA: Insufficient documentation

## 2023-06-09 DIAGNOSIS — Z01818 Encounter for other preprocedural examination: Secondary | ICD-10-CM | POA: Insufficient documentation

## 2023-06-09 HISTORY — DX: Prediabetes: R73.03

## 2023-06-09 LAB — BASIC METABOLIC PANEL
Anion gap: 9 (ref 5–15)
BUN: 12 mg/dL (ref 8–23)
CO2: 28 mmol/L (ref 22–32)
Calcium: 9.1 mg/dL (ref 8.9–10.3)
Chloride: 104 mmol/L (ref 98–111)
Creatinine, Ser: 0.64 mg/dL (ref 0.44–1.00)
GFR, Estimated: >60 mL/min (ref >60)
Glucose, Bld: 103 mg/dL — ABNORMAL HIGH (ref 70–99)
Potassium: 4.3 mmol/L (ref 3.5–5.1)
Sodium: 141 mmol/L (ref 135–145)

## 2023-06-09 LAB — CBC
HCT: 45 % (ref 36.0–46.0)
Hemoglobin: 14.7 g/dL (ref 12.0–15.0)
MCH: 29.2 pg (ref 26.0–34.0)
MCHC: 32.7 g/dL (ref 30.0–36.0)
MCV: 89.3 fL (ref 80.0–100.0)
Platelets: 370 10*3/uL (ref 150–400)
RBC: 5.04 MIL/uL (ref 3.87–5.11)
RDW: 12.5 % (ref 11.5–15.5)
WBC: 11.8 10*3/uL — ABNORMAL HIGH (ref 4.0–10.5)
nRBC: 0 % (ref 0.0–0.2)

## 2023-06-09 NOTE — Progress Notes (Addendum)
For Short Stay: COVID SWAB appointment date:  Bowel Prep reminder:   For Anesthesia: PCP - [name], MD  Cardiologist - Elder Negus, MD . LOV: 02/24/20  Chest x-ray -  EKG - 06/09/23 Stress Test -  ECHO - 08/05/19 Cardiac Cath -  Pacemaker/ICD device last checked: Pacemaker orders received: Device Rep notified:  Spinal Cord Stimulator: N/A  Sleep Study - N/A CPAP -   Fasting Blood Sugar - N/A Checks Blood Sugar ___0__ times a day Date and result of last Hgb A1c- 5.9: 05/04/23  Last dose of GLP1 agonist- N/A GLP1 instructions:   Last dose of SGLT-2 inhibitors- N/A SGLT-2 instructions:   Blood Thinner Instructions: N/A Aspirin Instructions: Last Dose:  Activity level: Can go up a flight of stairs and activities of daily living without stopping and without chest pain and/or shortness of breath   Able to exercise without chest pain and/or shortness of breath  Anesthesia review: Hx: HTN,Palpitations,Pre-DIA.  Patient denies shortness of breath, fever, cough and chest pain at PAT appointment   Patient verbalized understanding of instructions that were given to them at the PAT appointment. Patient was also instructed that they will need to review over the PAT instructions again at home before surgery.

## 2023-06-23 ENCOUNTER — Other Ambulatory Visit: Payer: Self-pay

## 2023-06-23 ENCOUNTER — Ambulatory Visit (HOSPITAL_COMMUNITY): Payer: BC Managed Care – PPO

## 2023-06-23 ENCOUNTER — Encounter (HOSPITAL_COMMUNITY): Payer: Self-pay | Admitting: General Surgery

## 2023-06-23 ENCOUNTER — Ambulatory Visit (HOSPITAL_COMMUNITY): Payer: BC Managed Care – PPO | Admitting: Physician Assistant

## 2023-06-23 ENCOUNTER — Encounter (HOSPITAL_COMMUNITY)
Admission: RE | Disposition: A | Discharge status: Home / Self Care | Payer: Self-pay | Source: Ambulatory Visit | Attending: General Surgery

## 2023-06-23 ENCOUNTER — Ambulatory Visit (HOSPITAL_COMMUNITY): Payer: BC Managed Care – PPO | Admitting: Certified Registered"

## 2023-06-23 ENCOUNTER — Ambulatory Visit (HOSPITAL_COMMUNITY)
Admission: RE | Admit: 2023-06-23 | Discharge: 2023-06-23 | Disposition: A | Discharge status: Home / Self Care | Payer: BC Managed Care – PPO | Source: Ambulatory Visit | Attending: General Surgery | Admitting: General Surgery

## 2023-06-23 DIAGNOSIS — J45909 Unspecified asthma, uncomplicated: Secondary | ICD-10-CM | POA: Insufficient documentation

## 2023-06-23 DIAGNOSIS — Z8249 Family history of ischemic heart disease and other diseases of the circulatory system: Secondary | ICD-10-CM | POA: Insufficient documentation

## 2023-06-23 DIAGNOSIS — K8010 Calculus of gallbladder with chronic cholecystitis without obstruction: Secondary | ICD-10-CM | POA: Diagnosis present

## 2023-06-23 DIAGNOSIS — I10 Essential (primary) hypertension: Secondary | ICD-10-CM | POA: Diagnosis not present

## 2023-06-23 HISTORY — PX: CHOLECYSTECTOMY: SHX55

## 2023-06-23 SURGERY — LAPAROSCOPIC CHOLECYSTECTOMY
Anesthesia: General

## 2023-06-23 MED ORDER — ACETAMINOPHEN 325 MG PO TABS: 650.0000 mg | ORAL_TABLET | Freq: Four times a day (QID) | ORAL | 0 refills | Status: AC

## 2023-06-23 MED ORDER — FENTANYL CITRATE (PF) 100 MCG/2ML IJ SOLN
INTRAMUSCULAR | Status: DC | PRN
  Administered 2023-06-23: 100 ug via INTRAVENOUS
  Administered 2023-06-23: 25 ug via INTRAVENOUS

## 2023-06-23 MED ORDER — 0.9 % SODIUM CHLORIDE (POUR BTL) OPTIME
TOPICAL | Status: DC | PRN
Start: 2023-06-23 — End: 2023-06-23
  Administered 2023-06-23: 1000 mL

## 2023-06-23 MED ORDER — INDOCYANINE GREEN 25 MG IV SOLR: 2.5000 mg | Freq: Once | INTRAVENOUS | Status: DC

## 2023-06-23 MED ORDER — MIDAZOLAM HCL 2 MG/2ML IJ SOLN
INTRAMUSCULAR | Status: DC | PRN
  Administered 2023-06-23: 2 mg via INTRAVENOUS

## 2023-06-23 MED ORDER — MIDAZOLAM HCL 2 MG/2ML IJ SOLN
INTRAMUSCULAR | Status: AC
  Filled 2023-06-23: qty 2

## 2023-06-23 MED ORDER — EPHEDRINE SULFATE-NACL 50-0.9 MG/10ML-% IV SOSY
PREFILLED_SYRINGE | INTRAVENOUS | Status: DC | PRN
  Administered 2023-06-23 (×5): 5 mg via INTRAVENOUS

## 2023-06-23 MED ORDER — OXYCODONE HCL 5 MG PO TABS: 5.0000 mg | ORAL_TABLET | Freq: Three times a day (TID) | ORAL | 0 refills | Status: DC | PRN

## 2023-06-23 MED ORDER — ONDANSETRON HCL 4 MG/2ML IJ SOLN
INTRAMUSCULAR | Status: DC | PRN
  Administered 2023-06-23: 4 mg via INTRAVENOUS

## 2023-06-23 MED ORDER — DEXAMETHASONE SODIUM PHOSPHATE 10 MG/ML IJ SOLN
INTRAMUSCULAR | Status: DC | PRN
  Administered 2023-06-23: 4 mg via INTRAVENOUS

## 2023-06-23 MED ORDER — DEXAMETHASONE SODIUM PHOSPHATE 10 MG/ML IJ SOLN
INTRAMUSCULAR | Status: AC
  Filled 2023-06-23: qty 1

## 2023-06-23 MED ORDER — ORAL CARE MOUTH RINSE: 15.0000 mL | Freq: Once | OROMUCOSAL | Status: AC

## 2023-06-23 MED ORDER — SUGAMMADEX SODIUM 200 MG/2ML IV SOLN
INTRAVENOUS | Status: DC | PRN
  Administered 2023-06-23: 140 mg via INTRAVENOUS

## 2023-06-23 MED ORDER — PROPOFOL 10 MG/ML IV BOLUS
INTRAVENOUS | Status: AC
  Filled 2023-06-23: qty 20

## 2023-06-23 MED ORDER — OXYCODONE HCL 5 MG PO TABS: 5.0000 mg | ORAL_TABLET | Freq: Once | ORAL | Status: DC | PRN

## 2023-06-23 MED ORDER — LACTATED RINGERS IV SOLN: INTRAVENOUS | Status: DC

## 2023-06-23 MED ORDER — BUPIVACAINE-EPINEPHRINE 0.25% -1:200000 IJ SOLN
INTRAMUSCULAR | Status: AC
  Filled 2023-06-23: qty 1

## 2023-06-23 MED ORDER — MAGNESIUM SULFATE 2 GM/50ML IV SOLN
2.0000 g | Freq: Once | INTRAVENOUS | Status: AC
  Administered 2023-06-23: 2 g via INTRAVENOUS
  Filled 2023-06-23: qty 50

## 2023-06-23 MED ORDER — KETAMINE HCL 10 MG/ML IJ SOLN
INTRAMUSCULAR | Status: AC
  Filled 2023-06-23: qty 1

## 2023-06-23 MED ORDER — KETAMINE HCL 10 MG/ML IJ SOLN
INTRAMUSCULAR | Status: DC | PRN
Start: 2023-06-23 — End: 2023-06-23
  Administered 2023-06-23: 30 mg via INTRAVENOUS

## 2023-06-23 MED ORDER — PROPOFOL 10 MG/ML IV BOLUS
INTRAVENOUS | Status: DC | PRN
  Administered 2023-06-23: 50 mg via INTRAVENOUS

## 2023-06-23 MED ORDER — OXYCODONE HCL 5 MG/5ML PO SOLN: 5.0000 mg | Freq: Once | ORAL | Status: DC | PRN

## 2023-06-23 MED ORDER — IBUPROFEN 200 MG PO TABS: 600.0000 mg | ORAL_TABLET | Freq: Four times a day (QID) | ORAL | 0 refills | Status: AC

## 2023-06-23 MED ORDER — FENTANYL CITRATE (PF) 100 MCG/2ML IJ SOLN
INTRAMUSCULAR | Status: AC
  Filled 2023-06-23: qty 2

## 2023-06-23 MED ORDER — BUPIVACAINE HCL (PF) 0.5 % IJ SOLN
INTRAMUSCULAR | Status: DC | PRN
Start: 2023-06-23 — End: 2023-06-23

## 2023-06-23 MED ORDER — ACETAMINOPHEN 10 MG/ML IV SOLN
1000.0000 mg | Freq: Once | INTRAVENOUS | Status: DC | PRN
  Administered 2023-06-23: 1000 mg via INTRAVENOUS

## 2023-06-23 MED ORDER — BUPIVACAINE LIPOSOME 1.3 % IJ SUSP: INTRAMUSCULAR | Status: DC | PRN

## 2023-06-23 MED ORDER — LACTATED RINGERS IR SOLN
Status: DC | PRN
Start: 2023-06-23 — End: 2023-06-23
  Administered 2023-06-23: 1000 mL

## 2023-06-23 MED ORDER — CHLORHEXIDINE GLUCONATE CLOTH 2 % EX PADS: 6.0000 | MEDICATED_PAD | Freq: Once | CUTANEOUS | Status: DC

## 2023-06-23 MED ORDER — ROCURONIUM BROMIDE 10 MG/ML (PF) SYRINGE
PREFILLED_SYRINGE | INTRAVENOUS | Status: DC | PRN
  Administered 2023-06-23: 60 mg via INTRAVENOUS
  Administered 2023-06-23: 5 mg via INTRAVENOUS
  Administered 2023-06-23: 10 mg via INTRAVENOUS

## 2023-06-23 MED ORDER — ROCURONIUM BROMIDE 10 MG/ML (PF) SYRINGE
PREFILLED_SYRINGE | INTRAVENOUS | Status: AC
  Filled 2023-06-23: qty 10

## 2023-06-23 MED ORDER — DROPERIDOL 2.5 MG/ML IJ SOLN: 0.6250 mg | Freq: Once | INTRAMUSCULAR | Status: DC | PRN

## 2023-06-23 MED ORDER — ACETAMINOPHEN 10 MG/ML IV SOLN
INTRAVENOUS | Status: AC
  Filled 2023-06-23: qty 100

## 2023-06-23 MED ORDER — LIDOCAINE 2% (20 MG/ML) 5 ML SYRINGE
INTRAMUSCULAR | Status: DC | PRN
  Administered 2023-06-23: 60 mg via INTRAVENOUS

## 2023-06-23 MED ORDER — BUPIVACAINE-EPINEPHRINE 0.25% -1:200000 IJ SOLN
INTRAMUSCULAR | Status: DC | PRN
  Administered 2023-06-23: 35 mL

## 2023-06-23 MED ORDER — FENTANYL CITRATE PF 50 MCG/ML IJ SOSY: 25.0000 ug | PREFILLED_SYRINGE | INTRAMUSCULAR | Status: DC | PRN

## 2023-06-23 MED ORDER — ONDANSETRON HCL 4 MG/2ML IJ SOLN
INTRAMUSCULAR | Status: AC
  Filled 2023-06-23: qty 2

## 2023-06-23 MED ORDER — CHLORHEXIDINE GLUCONATE 0.12 % MT SOLN
15.0000 mL | Freq: Once | OROMUCOSAL | Status: AC
  Administered 2023-06-23: 15 mL via OROMUCOSAL

## 2023-06-23 MED ORDER — CEFAZOLIN SODIUM-DEXTROSE 2-4 GM/100ML-% IV SOLN
2.0000 g | INTRAVENOUS | Status: AC
  Administered 2023-06-23: 2 g via INTRAVENOUS
  Filled 2023-06-23: qty 100

## 2023-06-23 SURGICAL SUPPLY — 43 items
ADH SKN CLS APL DERMABOND .7 (GAUZE/BANDAGES/DRESSINGS) ×1
APL PRP STRL LF DISP 70% ISPRP (MISCELLANEOUS) ×1
APPLIER CLIP 5 13 M/L LIGAMAX5 (MISCELLANEOUS)
APPLIER CLIP ROT 10 11.4 M/L (STAPLE) ×1
APR CLP MED LRG 11.4X10 (STAPLE) ×1
APR CLP MED LRG 5 ANG JAW (MISCELLANEOUS)
BAG SPEC RTRVL 10 TROC 200 (ENDOMECHANICALS) ×1
CABLE HIGH FREQUENCY MONO STRZ (ELECTRODE) IMPLANT
CATH CHOLANG 76X19 KUMAR (CATHETERS) IMPLANT
CHLORAPREP W/TINT 26 (MISCELLANEOUS) ×1 IMPLANT
CLIP APPLIE 5 13 M/L LIGAMAX5 (MISCELLANEOUS) IMPLANT
CLIP APPLIE ROT 10 11.4 M/L (STAPLE) IMPLANT
COVER MAYO STAND XLG (MISCELLANEOUS) IMPLANT
COVER SURGICAL LIGHT HANDLE (MISCELLANEOUS) ×1 IMPLANT
DERMABOND ADVANCED .7 DNX12 (GAUZE/BANDAGES/DRESSINGS) ×1 IMPLANT
DRAPE C-ARM 42X120 X-RAY (DRAPES) IMPLANT
ELECT PENCIL ROCKER SW 15FT (MISCELLANEOUS) ×1 IMPLANT
ELECT REM PT RETURN 15FT ADLT (MISCELLANEOUS) ×1 IMPLANT
ENDOLOOP SUT PDS II 0 18 (SUTURE) ×1 IMPLANT
GLOVE BIO SURGEON STRL SZ7 (GLOVE) ×1 IMPLANT
GOWN STRL REUS W/ TWL XL LVL3 (GOWN DISPOSABLE) ×1 IMPLANT
GOWN STRL REUS W/TWL XL LVL3 (GOWN DISPOSABLE) ×1
GRASPER SUT TROCAR 14GX15 (MISCELLANEOUS) IMPLANT
HEMOSTAT SNOW SURGICEL 2X4 (HEMOSTASIS) IMPLANT
IRRIG SUCT STRYKERFLOW 2 WTIP (MISCELLANEOUS)
IRRIGATION SUCT STRKRFLW 2 WTP (MISCELLANEOUS) IMPLANT
KIT BASIN OR (CUSTOM PROCEDURE TRAY) ×1 IMPLANT
KIT TURNOVER KIT A (KITS) IMPLANT
L-HOOK LAP DISP 36CM (ELECTROSURGICAL) ×1
LHOOK LAP DISP 36CM (ELECTROSURGICAL) ×1 IMPLANT
NDL INSUFFLATION 14GA 120MM (NEEDLE) ×1 IMPLANT
NEEDLE INSUFFLATION 14GA 120MM (NEEDLE) ×1
POUCH RETRIEVAL ECOSAC 10 (ENDOMECHANICALS) ×1 IMPLANT
SCISSORS LAP 5X35 DISP (ENDOMECHANICALS) ×1 IMPLANT
SET TUBE SMOKE EVAC HIGH FLOW (TUBING) ×1 IMPLANT
SLEEVE Z-THREAD 5X100MM (TROCAR) ×2 IMPLANT
SPIKE FLUID TRANSFER (MISCELLANEOUS) ×1 IMPLANT
SUT MNCRL AB 4-0 PS2 18 (SUTURE) ×1 IMPLANT
TOWEL OR 17X26 10 PK STRL BLUE (TOWEL DISPOSABLE) ×1 IMPLANT
TOWEL OR NON WOVEN STRL DISP B (DISPOSABLE) IMPLANT
TRAY LAPAROSCOPIC (CUSTOM PROCEDURE TRAY) ×1 IMPLANT
TROCAR ADV FIXATION 12X100MM (TROCAR) ×1 IMPLANT
TROCAR Z-THREAD OPTICAL 5X100M (TROCAR) ×1 IMPLANT

## 2023-06-23 NOTE — Anesthesia Procedure Notes (Deleted)
Anesthesia Regional Block: Interscalene brachial plexus block   Pre-Anesthetic Checklist: , timeout performed,  Correct Patient, Correct Site, Correct Laterality,  Correct Procedure, Correct Position, site marked,  Risks and benefits discussed,  Surgical consent,  Pre-op evaluation,  At surgeon's request and post-op pain management  Laterality: Left  Prep: chloraprep       Needles:  Injection technique: Single-shot  Needle Type: Echogenic Stimulator Needle     Needle Length: 9cm  Needle Gauge: 21     Additional Needles:   Procedures:,,,, ultrasound used (permanent image in chart),,    Narrative:  Start time: 06/23/2023 8:44 AM End time: 06/23/2023 8:46 AM Injection made incrementally with aspirations every 5 mL.  Performed by: Personally  Anesthesiologist: P[place] S[place] Nation, MD  Additional Notes: Discussed risks and benefits of the nerve block in detail, including but not limited vascular injury, permanent nerve damage and infection.   Patient tolerated the procedure well. Local anesthetic introduced in an incremental fashion under minimal resistance after negative aspirations. No paresthesias were elicited. After completion of the procedure, no acute issues were identified and patient continued to be monitored by RN.

## 2023-06-23 NOTE — H&P (Signed)
Carol Acosta 04/04/1961  [id number].    HPI:  62 y/o F w/ a hx of symptomatic cholelithiasis who presents for elective cholecystectomy and is in her usual state of health.  She denies any recent hospitalizations or changes in medication   ROS: Review of Systems  Constitutional: Negative.   HENT: Negative.    Eyes: Negative.   Respiratory: Negative.    Cardiovascular: Negative.   Gastrointestinal: Negative.   Genitourinary: Negative.   Musculoskeletal: Negative.   Skin: Negative.   Neurological: Negative.   Endo/Heme/Allergies: Negative.   Psychiatric/Behavioral: Negative.      Family History  Problem Relation Age of Onset   Asthma Mother    Allergies Mother    Hypertension Mother    Thyroid disease Mother    COPD Maternal Grandmother    Hypertension Maternal Grandmother    Allergic rhinitis Brother    Dementia Brother    Allergic rhinitis Brother    Breast cancer Neg Hx     Past Medical History:  Diagnosis Date   Allergic rhinitis    Asthma    Hiatal hernia 06/2020   History of COVID-19 06/2019   Hyperlipidemia    Hypertension    Osteopenia    Pre-diabetes     Past Surgical History:  Procedure Laterality Date   CESAREAN SECTION  2000   ENDOMETRIAL ABLATION     NASAL SINUS SURGERY  1989    Social History:  reports that she has never smoked. She has never used smokeless tobacco. She reports that she does not currently use alcohol. She reports that she does not use drugs.  Allergies:  Allergies  Allergen Reactions   Advair Diskus [Fluticasone-Salmeterol] Itching and Anxiety    Patient states that she felt like she was going crawl out of her skin.    Medications Prior to Admission  Medication Sig Dispense Refill   albuterol (PROVENTIL) (2.5 MG/3ML) 0.083% nebulizer solution Take 3 mLs (2.5 mg total) by nebulization every 6 (six) hours as needed for wheezing or shortness of breath. 75 mL 11   albuterol (VENTOLIN HFA) 108 (90 Base) MCG/ACT  inhaler Inhale 1 puff into the lungs as needed. 18 g 6   azelastine (OPTIVAR) 0.05 % ophthalmic solution Place 1 drop into both eyes daily as needed (allergies).     Cholecalciferol (VITAMIN D) 125 MCG (5000 UT) CAPS Take 5,000 Units by mouth 4 (four) times a week.     clotrimazole-betamethasone (LOTRISONE) cream Apply 1 application  topically daily as needed (on cesarean scar).     CVS TRIPLE MAGNESIUM COMPLEX PO Take 1 capsule by mouth every other day.     desloratadine (CLARINEX) 5 MG tablet Take 1 tablet (5 mg total) by mouth daily. (Patient taking differently: Take 5 mg by mouth daily as needed (rhinitis).) 90 tablet 3   Dupilumab (DUPIXENT) 300 MG/2ML SOPN INJECT 1 PEN UNDER THE SKIN EVERY 14 DAYS 12 mL 3   Estradiol (VAGIFEM) 10 MCG TABS vaginal tablet Place 1 tablet (10 mcg total) vaginally 2 (two) times a week. Use every night before bed for two weeks when you first begin this medicine, then after the first two weeks, begin using it twice a week. 34 tablet 3   estrogen, conjugated,-medroxyprogesterone (PREMPRO) 0.3-1.5 MG tablet Take 1 tablet by mouth daily. 90 tablet 3   fluticasone furoate-vilanterol (BREO ELLIPTA) 200-25 MCG/ACT AEPB Inhale 1 puff into the lungs daily. 90 each 3   montelukast (SINGULAIR) 10 MG tablet Take 1  tablet (10 mg total) by mouth at bedtime. 90 tablet 3   olmesartan-hydrochlorothiazide (BENICAR HCT) 40-12.5 MG tablet Take 1 tablet by mouth daily.     Polyethyl Glyc-Propyl Glyc PF (SYSTANE ULTRA PF) 0.4-0.3 % SOLN Place 1 drop into both eyes as needed (dry eyes).     Probiotic Product (PHILLIPS COLON HEALTH) CAPS Take 1 capsule by mouth every other day.     rosuvastatin (CRESTOR) 10 MG tablet Take 5 mg by mouth every evening.     RYALTRIS [id number] MCG/ACT SUSP Place 2 sprays into both nostrils daily.     EPINEPHrine 0.3 mg/0.3 mL IJ SOAJ injection Inject 0.3 mg into the muscle as needed for anaphylaxis.     estradiol (ESTRACE VAGINAL) 0.1 MG/GM vaginal cream APPLY  PEA-SIZE AMOUNT TO URETHRA TWICE A WEEK AT BEDTIME (Patient not taking: Reported on 06/08/2023) 42.5 g 0   Spacer/Aero-Holding Chambers (AEROCHAMBER MV) inhaler Use as instructed 1 each 0   sulfamethoxazole-trimethoprim (BACTRIM DS) 800-160 MG tablet Take 1 tablet by mouth 2 (two) times daily. One PO BID x 3 days (Patient not taking: Reported on 12/16/2022) 6 tablet 0    Physical Exam: There were no vitals taken for this visit. Gen: female, NAD Resp: no increased WOB CV: RRR Abd: soft, non-distended, non-tender Neuro: moving all extremities   No results found for this or any previous visit (from the past 48 hour(s)). No results found.  Assessment/Plan 62 y/o F who presents for an elective cholecystectomy and is in her usual state of health  - Will proceed to OR as planned - Tentative plan for discharge from PACU  I reviewed last 24 h vitals and pain scores.  Tacy Learn Surgery 06/23/2023, 7:54 AM Please see Amion for pager number during day hours 7:00am-4:30pm or 7:00am -11:30am on weekends

## 2023-06-23 NOTE — Transfer of Care (Signed)
Immediate Anesthesia Transfer of Care Note  Patient: Carol Acosta  Procedure(s) Performed: LAPAROSCOPIC CHOLECYSTECTOMY  Patient Location: PACU  Anesthesia Type:General  Level of Consciousness: awake, alert , and patient cooperative  Airway & Oxygen Therapy: Patient Spontanous Breathing and Patient connected to face mask oxygen  Post-op Assessment: Report given to RN and Post -op Vital signs reviewed and stable  Post vital signs: Reviewed and stable  Last Vitals:  Vitals Value Taken Time  BP 129/63 06/23/23 1149  Temp    Pulse 91 06/23/23 1151  Resp 20 06/23/23 1151  SpO2 100 % 06/23/23 1151  Vitals shown include unfiled device data.  Last Pain:  Vitals:   06/23/23 0803  TempSrc:   PainSc: 0-No pain      Patients Stated Pain Goal: 6 (06/23/23 0803)  Complications: No notable events documented.

## 2023-06-23 NOTE — Anesthesia Procedure Notes (Signed)
Procedure Name: Intubation Date/Time: 06/23/2023 10:01 AM  Performed by: Sindy Guadeloupe, CRNAPre-anesthesia Checklist: Patient identified, Emergency Drugs available, Suction available, Patient being monitored and Timeout performed Patient Re-evaluated:Patient Re-evaluated prior to induction Oxygen Delivery Method: Circle system utilized Preoxygenation: Pre-oxygenation with 100% oxygen Induction Type: IV induction Ventilation: Mask ventilation without difficulty Laryngoscope Size: Mac and 4 Grade View: Grade I Tube type: Oral Tube size: 7.0 mm Number of attempts: 1 Airway Equipment and Method: Stylet Placement Confirmation: ETT inserted through vocal cords under direct vision, positive ETCO2 and breath sounds checked- equal and bilateral Secured at: 21 cm Tube secured with: Tape Dental Injury: Teeth and Oropharynx as per pre-operative assessment

## 2023-06-23 NOTE — Anesthesia Postprocedure Evaluation (Signed)
Anesthesia Post Note  Patient: Carol Acosta  Procedure(s) Performed: LAPAROSCOPIC CHOLECYSTECTOMY     Patient location during evaluation: PACU Anesthesia Type: General Level of consciousness: awake and alert Pain management: pain level controlled Vital Signs Assessment: post-procedure vital signs reviewed and stable Respiratory status: spontaneous breathing, nonlabored ventilation, respiratory function stable and patient connected to nasal cannula oxygen Cardiovascular status: blood pressure returned to baseline and stable Postop Assessment: no apparent nausea or vomiting Anesthetic complications: no   No notable events documented.  Last Vitals:  Vitals:   06/23/23 1245 06/23/23 1250  BP: 106/80 128/63  Pulse: 76   Resp: 13 20  Temp: 37 C   SpO2: 100% 100%    Last Pain:  Vitals:   06/23/23 1250  TempSrc:   PainSc: 0-No pain                 [place] Nation

## 2023-06-23 NOTE — Op Note (Signed)
Patient: Carol Acosta MRN: [id number] DOB: 01-07-1961 Sex: female Operation/Procedure Date: 06/23/2023 Surgeons and Role:    * Jeston Junkins, Lucilla Edin, MD - Primary    * Berna Bue, MD Pre-operative Diagnoses: CHOLEITHIASIS Postoperative Diagnoses: CHOLEITHIASIS  Procedure performed: Procedures:   * LAPAROSCOPIC CHOLECYSTECTOMY Anesthesia: General endotracheal anesthesia   Indications: Carol Acosta is a 62 y.o. year old female who presents for elective cholecystectomy for symptomatic cholelithiasis. Preoperatively, I discussed in detail the risks, benefits, alternatives, and potential complications. The patient understands and requests to proceed.  Operative Findings: Cholelithiasis.  Critical view obtained prior to placing clips.   Operative Narrative: The patient was positively identified and was taken to the operating room and placed supine on the operating table. A time-out was performed confirming correct patient and procedure. We also confirmed initiation of deep venous thrombosis prophylaxis and wound prophylaxis. After successful induction of general endotracheal anesthesia, the arms were carefully padded. An orogastric tube and footboard were placed. The abdomen was prepped and draped in the usual sterile surgical fashion.  We began our peritoneal access with a veress needle inserted at Palmer's point.  After aspiration showed return of air bubbles and there was a positive saline drop test, the insufflation was connected and the abdomen brought to a pressure of .  We then used an opti-view technique to place a 5mm port just to the right of midline, superior to the umbilicus.  A laparoscope was introduced into the abdomen, and there were no signs of injury from entry.  A 12mm port was placed in the subxiphoid position.  Two additional 5mm ports were placed in the RUQ.  A 360-degree visualization with a 30-degree 5-mm laparoscope revealed grossly normal intra-abdominal  contents.   The patient was placed in the head up position and tilted slightly to the left. The dome of the gallbladder was then grasped, elevated, and retracted anteriorly and cephalad.  The infundibulum was retracted laterally and inferiorly exposing Calot's triangle. The investing visceral peritoneal attachments overlying the infundibulum of the gallbladder were incised using the hook electrocautery and dissected free from the gallbladder itself. We soon developed two structures into the gallbladder consistent with the cystic duct and cystic artery. The loose areolar tissue around these structures was dissected free. The gallbladder was separated from the gallbladder fossa for approximately a third the distance up from the cystic plate, establishing the critical view of safety. The cystic duct and cystic artery triply clipped. These were divided using laparoscopic scissors leaving a single clip on the removal side. The gallbladder was elevated off the gallbladder fossa using the hook Bovie. The gallbladder was then exteriorized through the subxiphoid port site using an EndoCatch bag. We reestablished pneumoperitoneum and confirmed no leakage of blood or bile. We confirmed integrity of our clips. The subhepatic space was irrigated with warm sterile saline and suctioned free. The 12mm port site was closed using an 0-vicryl on a suture passer. We placed 0.25% Marcaine with epinephrine at each incision site for local anesthesia. The skin was closed using 4-0 Monocryl subcuticular suture. Dermabond was applied. The patient tolerated the procedure well, was extubated, and taken to the recovery room.  Estimated Blood Loss: Minimal Specimens: Gallbladder Implants: None Drains: None Complications: None Condition of the patient: Good, extubated Disposition: PACU  Moise Boring Date: 06/23/2023 Time: 11:40 AM

## 2023-06-23 NOTE — Anesthesia Preprocedure Evaluation (Signed)
Anesthesia Evaluation  Patient identified by MRN, date of birth, ID band Patient awake    Reviewed: Allergy & Precautions, H&P , NPO status , Patient's Chart, lab work & pertinent test results  Airway Mallampati: II  TM Distance: >3 FB Neck ROM: Full    Dental no notable dental hx.    Pulmonary asthma    Pulmonary exam normal breath sounds clear to auscultation       Cardiovascular hypertension, negative cardio ROS Normal cardiovascular exam Rhythm:Regular Rate:Normal     Neuro/Psych negative neurological ROS  negative psych ROS   GI/Hepatic Neg liver ROS, hiatal hernia,,,  Endo/Other  negative endocrine ROS    Renal/GU negative Renal ROS  negative genitourinary   Musculoskeletal negative musculoskeletal ROS (+)    Abdominal   Peds negative pediatric ROS (+)  Hematology negative hematology ROS (+)   Anesthesia Other Findings   Reproductive/Obstetrics negative OB ROS                              Anesthesia Physical Anesthesia Plan  ASA: 3  Anesthesia Plan: General   Post-op Pain Management:    Induction: Intravenous  PONV Risk Score and Plan: Ondansetron and Dexamethasone  Airway Management Planned: Oral ETT  Additional Equipment:   Intra-op Plan:   Post-operative Plan: Extubation in OR  Informed Consent: I have reviewed the patients History and Physical, chart, labs and discussed the procedure including the risks, benefits and alternatives for the proposed anesthesia with the patient or authorized representative who has indicated his/her understanding and acceptance.     Dental advisory given  Plan Discussed with: CRNA  Anesthesia Plan Comments: (symptomatic cholelithiasis)         Anesthesia Quick Evaluation

## 2023-06-23 NOTE — Discharge Instructions (Signed)
Home Care After Gallbladder Removal  Activity  Limit activity for the first 24 hours, then you may return to normal daily activities. Returning to normal daily activities as soon as you can following surgery will enhance recovery time.  No heavy lifting pushing or pulling, anything heavier than 10 pounds (gallon of milk weighs approx. 8.8 pounds) for 2 weeks from surgery date.  Do not mow the lawn, use a vacuum cleaner, or do any other strenuous activities without first consulting your surgical team.  Climb stairs slowly and watch your step.  Walk as often as you feel able to increase strength and endurance.  No driving or operating heavy machinery within 24 hours of taking narcotic pain medication.  Diet  Drink plenty of fluids and eat a light meal on the night of surgery. Some patients may find their appetite is poor for a week or two after surgery. This is a normal result of the stress of surgery-your appetite will return in time.   There are no specific diet restrictions after surgery and you resume regular diet as tolerated. However, you may want to refrain from fatty, heavy, and/or greasy foods and follow a low fat diet for 2-4 weeks to avoid temporary diarrhea and nausea. Slowly add fats back into diet.   If you have diarrhea, try avoiding spicy foods, dairy products, fatty foods, and alcohol. You can also watch to see if specific foods cause it, and stop eating them. If the diarrhea continues for more than 2 weeks, talk to your doctor.  Dressings and Wound Care  Keep your wound or incision site clean and dry.   Dermabond/Durabond (skin glue): This will usually remain in place for 10-14 days, then naturally fall off your skin. You may take a shower 24 hrs after surgery, carefully wash, not scrub the incision site with a mild non-scented soap. Pat dry with a soft towel.  Do not pick or peel skin glue off.  Do not use creams, powder, salves or balms on your incision(s).  What to Expect After  Surgery  Moderate discomfort controlled with medications  You may feel pain in one or both shoulders. This pain comes from the gas still left in your belly after the surgery, if you had laparoscopic surgery (several small incisions). The pain should ease over several days to a week. Ambulation will help with this pain.   Minimal drainage from incision  Belly swelling  Feeling fatigue and weak  Loose stools after eating. This may occur for 4-8 weeks; however longer in some cases.   Constipation after surgery is common. Drink plenty fluids and eat a high fiber diet.   Pain Control: Prescribed Non-Narcotic Pain Medication  You will be given three prescriptions.  Two of them will be for prescription strength ibuprofen (i.e. Advil) and prescription strength acetaminophen (i.e. Tylenol).  The vast majority of patients will just need these two medications.  One prescription will be for a 'rescue' prescription of an oral narcotic (oxycodone).  You may fill this if needed.  You will alternate taking the ibuprofen (600mg ) every 6 hours and also the acetaminophen (650mg ) every 6 hours so that you are taking one of those medications every 3 hours.  For example: o 0800 - take ibuprofen 600mg  o 1100 - take acetaminophen 650mg  o 1400 - take ibuprofen 600mg  o 1700 - take acetaminophen 650mg  o Etc.  Continue taking this alternating pattern of ibuprofen and acetaminophen for 3 days  If you cannot take one or the  other of these medications, just take the one you can every 6 hours.  If you are comfortable at night, you don't have to wake up and take a medication.  If you are still uncomfortable after taking either ibuprofen or acetaminophen, try gentle stretching exercise and ice packs (a bag of frozen vegetables works great).  If you are still uncomfortable, you may fill the narcotic prescription of Oxycodone and take as directed.  Once you have completed these prescriptions, your pain level should be low enough  to stop taking medications altogether or just use an over the counter medication (ibuprofen or acetaminophen) as needed.   Pain Control: Over the Counter Medications to take as needed  Colace/Docusate: May be prescribed by your surgeon to prevent constipation caused by the combination of narcotics, effects of anesthesia, and decreased ambulation.  Hold for loose stools or diarrhea. Take 100 mg 1-2 times a day starting tonight.   Fiber: High fiber foods, extra liquids (water 9-13 cups/day) can also assist with constipation. Examples of high fiber foods are fruit, bran. Prune juice and water are also good liquids to drink.  Milk of Magnesia/Miralax:  If constipated despite take the over the counter stool softeners, you may take Milk of Magnesia or Miralax as directed on bottle to assist with constipation.     Pepcid/Famotidine: May be prescribed while taking naproxen (Aleve) or other NSAIDs such as ibuprofen (Motrin/Advil) to prevent stomach upset or Acid-reflux symptoms. Take 1 tablet 1-2 times a day.   **Constipation: The first bowel movement may occur anywhere between 1-5 days after surgery.  As long as you are not nauseated or not having significant abdominal pain this variation is acceptable. Narcotic pain medications can cause constipation increasing discomfort; early discontinuation will assist with bowel management.If constipated despite taking stool softeners,  you may take Milk of Magnesia or Miralax as directed on the bottle.     **Home medications: You may restart your home medications as directed by your respective Primary Care Physician or Surgeon.   When to notify your Doctor or Healthcare Team  Sign of Wound Infection   Fever over 100 degrees.  Wound becomes extremely swollen, shows red streaks, warm to the touch, and/or drainage from the incision site or foul-smelling drainage.  Wound edges separate or opens up  Bleeding or bruising   If you have bleeding, apply pressure to the  site and hold the pressure firmly for 5 minutes. If the bleeding continues, apply pressure again and call 911. If the bleeding stopped, call your doctor to report it.   Call your doctor or nurse if you have increased bleeding from your site and increased bruising or a lump forms or gets larger under your skin at the site.  Unrelieved Pain    Call your doctor or nurse if your pain gets worse or is not eased 1 hour after taking your pain medicine, or if it is severe and uncontrolled.  Fever, Flu-like symptoms   Fever over 100 degrees and/or chills  Gastrointestinal Symptoms    If you have yellowing of your eyes or skin (jaundice)  If you have dark or rust-colored urine  If your stool is gray colored   If you have nausea and vomiting that continues more than 24 hours, will not let you keep medicine down and will not let you keep fluids down  Black tarry bowel movements. This can be normal after surgery on the stomach, but should resolve in a day or two.  Call 911 if you suddenly have signs of blood loss such as:  Vomiting blood  Fast heart rate  Feeling faint, sweaty, or blacking out  Passing bright red blood from your rectum  Blood Clot Symptoms   Tender, swollen or reddened areas in your calf muscle or thighs.  Numbness or tingling in your lower leg or calf, or at the top of your leg or groin  Skin on your leg looks pale or blue or feels cold to touch  Chest pain or have trouble breathing, lightheadedness, fast heart rate  Sudden Onset of Symptoms    Call 911 if you suddenly have:  Leg weakness and spasm  Loss of bladder or bowel function  Seizure  Confusion, severe headache, dizziness or feeling unsteady, problems talking, difficulty swallowing, and/or numbness or muscle weakness as these could be signs of a stroke.   Follow up Appointment Your follow up appointment should be scheduled 2-3 weeks after your surgery date.  If you have not previously scheduled for a follow-up visit  you can be scheduled by contacting [phone]

## 2023-06-24 ENCOUNTER — Encounter (HOSPITAL_COMMUNITY): Payer: Self-pay | Admitting: General Surgery

## 2023-06-26 LAB — SURGICAL PATHOLOGY

## 2023-06-29 NOTE — Progress Notes (Signed)
62 y.o. G2P2 Married Caucasian female here for annual exam.    On HRT every day now.  Also using Vagifem tablets and using Estrace cream to urethra.  She wants to continue these.  Has prediabetes.   Coronary calcium score is zero per patient.   Had Covid in July.   Cholecystectomy done 06/23/23.   Lost 5 - 7 pounds in the last 2 months.   Not SA. Husband had stroke and MI.   PCP:   Dr. Renne Crigler  No LMP recorded. Patient has had an ablation.           Sexually active: No.  The current method of family planning is ablation.    Exercising: Yes.     Walking, recovering from surgery Smoker:  no  Health Maintenance: Pap:  06-25-20 Neg:Neg HR HPV, 10-29-14 Neg:Neg HR HPV  History of abnormal Pap:  yes, years ago in 1990's unsure if had cryotherapy.  MMG:  05/29/23 Breast density Cat C, BI-RADS CAT 1 neg Colonoscopy:  2015 normal BMD:   09/04/19  Result  osteopenia of spine TDaP:  PCP Gardasil:   no HIV: n/a Hep C: n/a Screening Labs:   PCP She will do Covid and flu vaccines at her pharmacy.   reports that she has never smoked. She has never been exposed to tobacco smoke. She has never used smokeless tobacco. She reports that she does not currently use alcohol. She reports that she does not use drugs.  Past Medical History:  Diagnosis Date   Allergic rhinitis    Asthma    Hiatal hernia 06/2020   History of COVID-19 06/2019   Hyperlipidemia    Hypertension    Osteopenia    Pre-diabetes     Past Surgical History:  Procedure Laterality Date   CESAREAN SECTION  2000   CHOLECYSTECTOMY N/A 06/23/2023   Procedure: LAPAROSCOPIC CHOLECYSTECTOMY;  Surgeon: Moise Boring, MD;  Location: [facility];  Service: General;  Laterality: N/A;   ENDOMETRIAL ABLATION     NASAL SINUS SURGERY  1989    Current Outpatient Medications  Medication Sig Dispense Refill   albuterol (PROVENTIL) (2.5 MG/3ML) 0.083% nebulizer solution Take 3 mLs (2.5 mg total) by nebulization every 6 (six) hours  as needed for wheezing or shortness of breath. 75 mL 11   albuterol (VENTOLIN HFA) 108 (90 Base) MCG/ACT inhaler Inhale 1 puff into the lungs as needed. 18 g 6   azelastine (OPTIVAR) 0.05 % ophthalmic solution Place 1 drop into both eyes daily as needed (allergies).     Cholecalciferol (VITAMIN D) 125 MCG (5000 UT) CAPS Take 5,000 Units by mouth 4 (four) times a week.     clotrimazole-betamethasone (LOTRISONE) cream Apply 1 application  topically daily as needed (on cesarean scar).     [employer] TRIPLE MAGNESIUM COMPLEX PO Take 1 capsule by mouth every other day.     desloratadine (CLARINEX) 5 MG tablet Take 1 tablet (5 mg total) by mouth daily. (Patient taking differently: Take 5 mg by mouth daily as needed (rhinitis).) 90 tablet 3   Dupilumab (DUPIXENT) 300 MG/2ML SOPN INJECT 1 PEN UNDER THE SKIN EVERY 14 DAYS 12 mL 3   EPINEPHrine 0.3 mg/0.3 mL IJ SOAJ injection Inject 0.3 mg into the muscle as needed for anaphylaxis.     estradiol (ESTRACE VAGINAL) 0.1 MG/GM vaginal cream APPLY PEA-SIZE AMOUNT TO URETHRA TWICE A WEEK AT BEDTIME 42.5 g 0   Estradiol (VAGIFEM) 10 MCG TABS vaginal tablet Place 1 tablet (10 mcg  total) vaginally 2 (two) times a week. Use every night before bed for two weeks when you first begin this medicine, then after the first two weeks, begin using it twice a week. 34 tablet 3   estrogen, conjugated,-medroxyprogesterone (PREMPRO) 0.3-1.5 MG tablet Take 1 tablet by mouth daily. 90 tablet 3   fluticasone furoate-vilanterol (BREO ELLIPTA) 200-25 MCG/ACT AEPB Inhale 1 puff into the lungs daily. 90 each 3   montelukast (SINGULAIR) 10 MG tablet Take 1 tablet (10 mg total) by mouth at bedtime. 90 tablet 3   olmesartan-hydrochlorothiazide (BENICAR HCT) 40-12.5 MG tablet Take 1 tablet by mouth daily.     Polyethyl Glyc-Propyl Glyc PF (SYSTANE ULTRA PF) 0.4-0.3 % SOLN Place 1 drop into both eyes as needed (dry eyes).     Probiotic Product (PHILLIPS COLON HEALTH) CAPS Take 1 capsule by mouth every  other day.     rosuvastatin (CRESTOR) 10 MG tablet Take 5 mg by mouth every evening.     RYALTRIS [id number] MCG/ACT SUSP Place 2 sprays into both nostrils daily.     Spacer/Aero-Holding Chambers (AEROCHAMBER MV) inhaler Use as instructed 1 each 0   No current facility-administered medications for this visit.    Family History  Problem Relation Age of Onset   Asthma Mother    Allergies Mother    Hypertension Mother    Thyroid disease Mother    COPD Maternal Grandmother    Hypertension Maternal Grandmother    Allergic rhinitis Brother    Dementia Brother    Allergic rhinitis Brother    Breast cancer Neg Hx     Review of Systems  All other systems reviewed and are negative.   Exam:   BP 118/76 (BP Location: Left Arm, Patient Position: Sitting, Cuff Size: Normal)   Ht 5' 5.5" (1.664 m)   Wt 140 lb (63.5 kg)   BMI 22.94 kg/m     General appearance: alert, cooperative and appears stated age Head: normocephalic, without obvious abnormality, atraumatic Neck: no adenopathy, supple, symmetrical, trachea midline and thyroid normal to inspection and palpation Lungs: clear to auscultation bilaterally Breasts: normal appearance, no masses or tenderness, No nipple retraction or dimpling, No nipple discharge or bleeding, No axillary adenopathy Heart: regular rate and rhythm Abdomen: soft, non-tender; no masses, no organomegaly Extremities: extremities normal, atraumatic, no cyanosis or edema Skin: skin color, texture, turgor normal. No rashes or lesions Lymph nodes: cervical, supraclavicular, and axillary nodes normal. Neurologic: grossly normal  Pelvic: External genitalia:  no lesions              No abnormal inguinal nodes palpated.              Urethra:  normal appearing urethra with no masses, tenderness or lesions              Bartholins and Skenes: normal                 Vagina: normal appearing vagina with normal color and discharge, no lesions              Cervix: no lesions               Pap taken: no Bimanual Exam:  Uterus:  normal size, contour, position, consistency, mobility, non-tender              Adnexa: no mass, fullness, tenderness              Rectal exam: yes.  Confirms.  Anus:  normal sphincter tone, no lesions  Chaperone was present for exam:  Warren Lacy, CMA  Assessment:   Well woman visit with gynecologic exam. Status post endometrial ablation.  HRT.  Osteopenia.  PCP following.  HTN, elevated A1C, elevated cholesterol. Asthma.  Plan: Mammogram screening discussed. Self breast awareness reviewed. Pap and HR HPV 2026. Guidelines for Calcium, Vitamin D, regular exercise program including cardiovascular and weight bearing exercise. Discused WHI and use of HRT which can increase risk of PE, DVT, MI, stroke and breast cancer.  Refill of Prempro, vaginal estradiol cream for the urethra, and Vagifem.   She will pursue a BMD with her PCP.  Labs with PCP. Follow up annually and prn.

## 2023-07-13 ENCOUNTER — Encounter: Payer: Self-pay | Admitting: Obstetrics and Gynecology

## 2023-07-13 ENCOUNTER — Ambulatory Visit (INDEPENDENT_AMBULATORY_CARE_PROVIDER_SITE_OTHER): Payer: BC Managed Care – PPO | Admitting: Obstetrics and Gynecology

## 2023-07-13 VITALS — BP 118/76 | Ht 65.5 in | Wt 140.0 lb

## 2023-07-13 DIAGNOSIS — Z01419 Encounter for gynecological examination (general) (routine) without abnormal findings: Secondary | ICD-10-CM | POA: Diagnosis not present

## 2023-07-13 MED ORDER — ESTRADIOL 10 MCG VA TABS: 1.0000 | ORAL_TABLET | VAGINAL | 3 refills | Status: DC

## 2023-07-13 MED ORDER — PREMPRO 0.3-1.5 MG PO TABS: 1.0000 | ORAL_TABLET | Freq: Every day | ORAL | 3 refills | Status: DC

## 2023-07-13 MED ORDER — ESTRADIOL 0.1 MG/GM VA CREA: TOPICAL_CREAM | VAGINAL | 1 refills | Status: DC

## 2023-07-13 NOTE — Patient Instructions (Signed)

## 2023-08-29 ENCOUNTER — Other Ambulatory Visit (HOSPITAL_COMMUNITY): Payer: Self-pay

## 2023-09-05 ENCOUNTER — Other Ambulatory Visit: Payer: Self-pay | Admitting: Pulmonary Disease

## 2023-09-05 ENCOUNTER — Other Ambulatory Visit: Payer: Self-pay

## 2023-09-05 ENCOUNTER — Telehealth: Payer: Self-pay | Admitting: Pulmonary Disease

## 2023-09-05 MED ORDER — FLUTICASONE FUROATE-VILANTEROL 200-25 MCG/ACT IN AEPB: 1.0000 | INHALATION_SPRAY | Freq: Every day | RESPIRATORY_TRACT | 3 refills | Status: DC

## 2023-09-05 NOTE — Telephone Encounter (Signed)
Patient needs refill of Breo to be prescribed for 1 year.

## 2023-09-06 NOTE — Telephone Encounter (Signed)
Refill sent.

## 2023-10-09 LAB — LAB REPORT - SCANNED
A1c: 5.9
EGFR: 95

## 2023-11-21 ENCOUNTER — Other Ambulatory Visit: Payer: Self-pay | Admitting: Pulmonary Disease

## 2023-11-21 ENCOUNTER — Ambulatory Visit: Payer: 59 | Admitting: Acute Care

## 2023-11-21 ENCOUNTER — Encounter: Payer: Self-pay | Admitting: Acute Care

## 2023-11-21 VITALS — BP 117/70 | HR 69 | Ht 65.5 in | Wt 145.6 lb

## 2023-11-21 DIAGNOSIS — J4540 Moderate persistent asthma, uncomplicated: Secondary | ICD-10-CM | POA: Diagnosis not present

## 2023-11-21 DIAGNOSIS — J4550 Severe persistent asthma, uncomplicated: Secondary | ICD-10-CM

## 2023-11-21 DIAGNOSIS — J8283 Eosinophilic asthma: Secondary | ICD-10-CM

## 2023-11-21 MED ORDER — ALBUTEROL SULFATE (2.5 MG/3ML) 0.083% IN NEBU: 2.5000 mg | INHALATION_SOLUTION | Freq: Four times a day (QID) | RESPIRATORY_TRACT | 11 refills | Status: AC | PRN

## 2023-11-21 MED ORDER — FLUTICASONE FUROATE-VILANTEROL 200-25 MCG/ACT IN AEPB: 1.0000 | INHALATION_SPRAY | Freq: Every day | RESPIRATORY_TRACT | 3 refills | Status: AC

## 2023-11-21 MED ORDER — AZELASTINE HCL 0.1 % NA SOLN
2.0000 | Freq: Two times a day (BID) | NASAL | 12 refills | Status: DC
Start: 2023-11-21 — End: 2024-03-07

## 2023-11-21 MED ORDER — DUPIXENT 300 MG/2ML ~~LOC~~ SOAJ
1.0000 | SUBCUTANEOUS | 0 refills | Status: DC
Start: 2024-01-20 — End: 2023-11-24

## 2023-11-21 MED ORDER — ALBUTEROL SULFATE HFA 108 (90 BASE) MCG/ACT IN AERS
2.0000 | INHALATION_SPRAY | Freq: Four times a day (QID) | RESPIRATORY_TRACT | 6 refills | Status: DC | PRN
Start: 2023-11-21 — End: 2024-01-02

## 2023-11-21 MED ORDER — MONTELUKAST SODIUM 10 MG PO TABS
10.0000 mg | ORAL_TABLET | Freq: Every day | ORAL | 11 refills | Status: AC
Start: 2023-11-21 — End: ?

## 2023-11-21 NOTE — Progress Notes (Signed)
History of Present Illness Carol Acosta is a 63 y.o. female Referred in March 2021 for asthma by [name], MD.She has been followed by Dr. Tonia Brooms.  Synopsis 63 year old female past medical history of asthma, hypertension hyperlipidemia, allergic rhinitis. Patient presents to the office for evaluation of asthma. She has had asthma since child. She has had allergy skin testing with multiple positivities. She had September covid mild symptoms, dx sept 3rd. She always gets flare ups in aug, September, again with issues in the spring time. She has seen cardiology for management of HTN and HLD. She retired Nov 1st. Her anxiety is much better down. She still having recurrent flares. She is married, twin 30 years olds, and 62 year old. Currently managed with Breo 200, was on prednisone three times this past year.  Maintenance is Dupixent and Breo.  11/21/2023 Pt. Presents for follow up. She has been doing well. No recent flares of her asthma. She is compliant with her Breo, Dupixent injections every 2 weeks , Singulair , nasal spray , Flonase , and albuterol nebs/inhaler as needed for breakthrough shortness of breath.   She does endorse that she notes her secretions are different when her diet includes sugar and she has bad days in regard to her asthma after ingesting sugar.  She has made the appropriate dietary changes.. We have refilled her Dupixent, and started her on Astelin nasal spray, renewed Breo, Albuterol inhaler and nebs.  We need to get her established with another one of our pulmonary physicians at her next follow-up in 12 months, as Dr. Tonia Brooms is no longer with the practice.  She understands to call for any breakthrough or flare of her asthma so we can get her seen quickly.    Test Results:     Latest Ref Rng & Units 06/09/2023    8:25 AM 02/09/2022    9:33 AM 01/01/2020    9:58 AM  CBC  WBC 4.0 - 10.5 K/uL 11.8  14.0  11.5   Hemoglobin 12.0 - 15.0 g/dL 09.8  11.9  14.7    Hematocrit 36.0 - 46.0 % 45.0  43.5  47.1   Platelets 150 - 400 K/uL 370  283.0  263.0        Latest Ref Rng & Units 06/09/2023    8:25 AM  BMP  Glucose 70 - 99 mg/dL 829   BUN 8 - 23 mg/dL 12   Creatinine 5.62 - 1.00 mg/dL 1.30   Sodium 865 - 784 mmol/L 141   Potassium 3.5 - 5.1 mmol/L 4.3   Chloride 98 - 111 mmol/L 104   CO2 22 - 32 mmol/L 28   Calcium 8.9 - 10.3 mg/dL 9.1     BNP No results found for: "BNP"  ProBNP No results found for: "PROBNP"  PFT    Component Value Date/Time   FEV1PRE 2.47 02/12/2020 0958   FEV1POST 2.51 02/12/2020 0958   FVCPRE 3.37 02/12/2020 0958   FVCPOST 3.39 02/12/2020 0958   TLC 5.51 02/12/2020 0958   DLCOUNC 25.74 02/12/2020 0958   PREFEV1FVCRT 73 02/12/2020 0958   PSTFEV1FVCRT 74 02/12/2020 0958    No results found.   Past medical hx Past Medical History:  Diagnosis Date   Allergic rhinitis    Asthma    Hiatal hernia 06/2020   History of COVID-19 06/2019   Hyperlipidemia    Hypertension    Osteopenia    Pre-diabetes      Social History   Tobacco Use  Smoking status: Never    Passive exposure: Never   Smokeless tobacco: Never  Vaping Use   Vaping status: Never Used  Substance Use Topics   Alcohol use: Not Currently   Drug use: No    Ms.Wyles reports that she has never smoked. She has never been exposed to tobacco smoke. She has never used smokeless tobacco. She reports that she does not currently use alcohol. She reports that she does not use drugs.  Tobacco Cessation: Never smoker   Past surgical hx, Family hx, Social hx all reviewed.  Current Outpatient Medications on File Prior to Visit  Medication Sig   albuterol (PROVENTIL) (2.5 MG/3ML) 0.083% nebulizer solution Take 3 mLs (2.5 mg total) by nebulization every 6 (six) hours as needed for wheezing or shortness of breath.   albuterol (VENTOLIN HFA) 108 (90 Base) MCG/ACT inhaler Inhale 1 puff into the lungs as needed.   azelastine (OPTIVAR) 0.05 %  ophthalmic solution Place 1 drop into both eyes daily as needed (allergies).   Cholecalciferol (VITAMIN D) 125 MCG (5000 UT) CAPS Take 5,000 Units by mouth 4 (four) times a week.   clotrimazole-betamethasone (LOTRISONE) cream Apply 1 application  topically daily as needed (on cesarean scar).   [employer] TRIPLE MAGNESIUM COMPLEX PO Take 1 capsule by mouth every other day.   desloratadine (CLARINEX) 5 MG tablet Take 1 tablet (5 mg total) by mouth daily. (Patient taking differently: Take 5 mg by mouth daily as needed (rhinitis).)   Dupilumab (DUPIXENT) 300 MG/2ML SOPN INJECT 1 PEN UNDER THE SKIN EVERY 14 DAYS   EPINEPHrine 0.3 mg/0.3 mL IJ SOAJ injection Inject 0.3 mg into the muscle as needed for anaphylaxis.   estradiol (ESTRACE VAGINAL) 0.1 MG/GM vaginal cream APPLY PEA-SIZE AMOUNT TO URETHRA TWICE A WEEK AT BEDTIME   Estradiol (VAGIFEM) 10 MCG TABS vaginal tablet Place 1 tablet (10 mcg total) vaginally 2 (two) times a week.   estrogen, conjugated,-medroxyprogesterone (PREMPRO) 0.3-1.5 MG tablet Take 1 tablet by mouth daily.   fluticasone furoate-vilanterol (BREO ELLIPTA) 200-25 MCG/ACT AEPB Inhale 1 puff into the lungs daily.   montelukast (SINGULAIR) 10 MG tablet Take 1 tablet (10 mg total) by mouth at bedtime.   olmesartan-hydrochlorothiazide (BENICAR HCT) 40-12.5 MG tablet Take 1 tablet by mouth daily.   Polyethyl Glyc-Propyl Glyc PF (SYSTANE ULTRA PF) 0.4-0.3 % SOLN Place 1 drop into both eyes as needed (dry eyes).   Probiotic Product (PHILLIPS COLON HEALTH) CAPS Take 1 capsule by mouth every other day.   rosuvastatin (CRESTOR) 10 MG tablet Take 5 mg by mouth every evening.   RYALTRIS [id number] MCG/ACT SUSP Place 2 sprays into both nostrils daily.   Spacer/Aero-Holding Chambers (AEROCHAMBER MV) inhaler Use as instructed   No current facility-administered medications on file prior to visit.     Allergies  Allergen Reactions   Advair Diskus [Fluticasone-Salmeterol] Itching and Anxiety    Patient  states that she felt like she was going crawl out of her skin.    Review Of Systems:  Constitutional:   No  weight loss, night sweats,  Fevers, chills, fatigue, or  lassitude.  HEENT:   No headaches,  Difficulty swallowing,  Tooth/dental problems, or  Sore throat,                No sneezing, itching, ear ache, nasal congestion, post nasal drip,   CV:  No chest pain,  Orthopnea, PND, swelling in lower extremities, anasarca, dizziness, palpitations, syncope.   GI  No heartburn, indigestion, abdominal pain, nausea,  vomiting, diarrhea, change in bowel habits, loss of appetite, bloody stools.   Resp: No shortness of breath with exertion or at rest.  No excess mucus, no productive cough,  No non-productive cough,  No coughing up of blood.  No change in color of mucus.  No wheezing.  No chest wall deformity  Skin: no rash or lesions.  GU: no dysuria, change in color of urine, no urgency or frequency.  No flank pain, no hematuria   MS:  No joint pain or swelling.  No decreased range of motion.  No back pain.  Psych:  No change in mood or affect. No depression or anxiety.  No memory loss.   Vital Signs BP 117/70   Pulse 69   Ht 5' 5.5" (1.664 m)   Wt 145 lb 9.6 oz (66 kg)   SpO2 99% Comment: room air  BMI 23.86 kg/m    Physical Exam:  General- No distress,  A&Ox3, pleasant ENT: No sinus tenderness, TM clear, pale nasal mucosa, no oral exudate,no post nasal drip, no LAN Cardiac: S1, S2, regular rate and rhythm, no murmur Chest: No wheeze/ rales/ dullness; no accessory muscle use, no nasal flaring, no sternal retractions Abd.: Soft Non-tender, nondistended, bowel sounds positive,Body mass index is 23.86 kg/m.  Ext: No clubbing cyanosis, edema, no obvious deformities Neuro:  normal strength, moving all extremities x 4, alert and oriented x 3 Skin: No rashes, warm and dry, no obvious skin lesions Psych: normal mood and behavior   Assessment/Plan Well-controlled eosinophilic  asthma Excellent medication compliance Plan Continue breo 200  Continue dupixent with home injections every 2 weeks Continue allergy shots and follow up with Dr. Campo Callas  Continue singulair daily  I have renewed your Dupixent , Singulair, Albuterol and Breo, and started you on Astelin nasal spray.  Please let us know if you have any issues with refills.  Follow up as needed for any flares, and in 1 year for you regular follow up.  Continue your albuterol inhaler and nebs as needed Call to be seen early if you start having symptoms of a flare Annual follow-up will be with a new pulmonary provider to get you established. Please contact office for sooner follow up if symptoms do not improve or worsen or seek emergency care    I spent 35 minutes dedicated to the care of this patient on the date of this encounter to include pre-visit review of records, face-to-face time with the patient discussing conditions above, post visit ordering of testing, clinical documentation with the electronic health record, making appropriate referrals as documented, and communicating necessary information to the patient's healthcare team.   Bevelyn Ngo, NP 11/21/2023  8:34 AM

## 2023-11-21 NOTE — Patient Instructions (Addendum)
It is good to see you today. I am glad you have had a stable interval.  I have renewed your Dupixent , Singulair, Albuterol and Breo, and started you on Astelin nasal spray.  Please let us know if you have any issues with refills.  Follow up as needed for any flares, and in 1 year for you regular follow up.  Continue your albuterol inhaler and nebs as needed Continue your Singulair as you have been doing. Please contact office for sooner follow up if symptoms do not improve or worsen or seek emergency care

## 2023-11-24 NOTE — Addendum Note (Signed)
Addended by: Murrell Redden on: 11/24/2023 07:55 AM   Modules accepted: Orders

## 2024-01-02 ENCOUNTER — Ambulatory Visit: Admitting: Internal Medicine

## 2024-01-02 ENCOUNTER — Encounter: Payer: Self-pay | Admitting: Internal Medicine

## 2024-01-02 ENCOUNTER — Ambulatory Visit: Payer: Self-pay | Admitting: Acute Care

## 2024-01-02 VITALS — BP 118/66 | HR 79 | Temp 98.5°F | Ht 65.5 in | Wt 144.4 lb

## 2024-01-02 DIAGNOSIS — J45901 Unspecified asthma with (acute) exacerbation: Secondary | ICD-10-CM | POA: Diagnosis not present

## 2024-01-02 DIAGNOSIS — Z8709 Personal history of other diseases of the respiratory system: Secondary | ICD-10-CM

## 2024-01-02 MED ORDER — PREDNISONE 10 MG PO TABS: ORAL_TABLET | ORAL | 0 refills | Status: DC

## 2024-01-02 NOTE — Telephone Encounter (Signed)
 Call from [employer], spoke to [place], office reporting pt experiencing SOB. Speaking to pt. Potential appt today at 9 am.  TRIAGE SUMMARY NOTE: Pt reporting she is flu positive as of 3/16 and confirms moderate to severe persistent asthma, was prescribed and completed tamiflu, also prescribed augmentin 10 days, pt almost done with 10 days and still coughing up yellow sputum. Pt reporting that she is having much more SOB than usual but helped by nebulizer and coughing up the sputum. using her nebulizer 2-3x per day and using her albuterol inhaler at midday if out of the home and away from nebulizer. Pt reporting wheezing over the past few days but states she cannot hear herself wheezing today. Pt confirms no fever, just "severe fatigue" still from flu and SOB, pt reporting only coughing when nebulizer lets her cough things up. Pt denies chest pain but states pressure on chest and catching her breath "like have run a marathon" after going up stairs. Advised pt be examined asap, pt is 3 min from office and prefers the 9 am appt 30-40 min from now if still available, warm transferred to [employer] office, office scheduled for 9 am appt. Advised ED if any worsening or feeling like pressure on chest is out of ordinary for her usual when SOB. Pt verbalized understanding.  E2C2 Pulmonary Triage - Initial Assessment Questions "Chief Complaint (e.g., cough, sob, wheezing, fever, chills, sweat or additional symptoms) *Go to specific symptom protocol after initial questions. Would say maybe moderate to severe, not typically like this, walking up the stairs feel like have run a marathon just felt like something pushing on my chest, even talking feeling SOB concerns me the most Need to check the fatigue, 2nd week flu fatigue, recent flu with tamiflu Pt talking in full sentences, no wheezing heard over phone No fever just severe fatigue and SOB Finish up augmentin soon but coughing up still yellow when nebulizer  so concerning, helps SOB to cough that up and nebulizer Have moderate to severe allergies and asthma, have dupixent last on 3/13, due to have another one this weekend, all this started 3/7 got allergy shot then by 8 pm struggling to breathe, had 3 prednisone pills in pocket, took 10 mg one because something not right, would take in morning to see if would open up airway 3/11 was running it's course takes 3 days, 3/11 realized turning into infection again, had 5 days augmentin on me from couple months ago, took 2 days of it, augmentin ran out Saturday morning, Sunday morning 2 am woke up with fever took test positive for flu A so went to [facility] in [place], tamiflu and 10 days augmentin, finish out that round, finished tamiflu on Thursday and augmentin still taking  "How long have symptoms been present?" Think all started after the tamiflu, little winded on Friday, Saturday started going downhill with energy level and SOB  Have you tested for COVID or Flu? Note: If not, ask patient if a home test can be taken. If so, instruct patient to call back for positive results. Yes Flu positive 1 week ago on 3/16  MEDICINES:   "Have you used any OTC meds to help with symptoms?" No  "Have you used your inhalers/maintenance medication?" Yes If yes, "What medications?" Using nebulizer morning and evening  If inhaler, ask "How many puffs and how often?" Note: Review instructions on medication in the chart. Using albuterol inhaler during the day, maybe midday, if at home will do the nebulizer,  so using nebulizer 2-3x a day to help cough stuff up  OXYGEN: "Do you wear supplemental oxygen?" No  "Do you monitor your oxygen levels?" No  "What is your usual oxygen saturation reading?"  (Note: Pulmonary O2 sats should be 90% or greater) 97-98%  Reason for Disposition  [1] Longstanding difficulty breathing (e.g., CHF, COPD, emphysema) AND [2] WORSE than normal  Answer Assessment - Initial Assessment  Questions 1. RESPIRATORY STATUS: "Describe your breathing?" (e.g., wheezing, shortness of breath, unable to speak, severe coughing)      Wheezing yesterday and prior to that every day, right now since started moving and doing a lot, take a breath feel SOB right now but if wheezing cannot hear it 3. PATTERN "Does the difficult breathing come and go, or has it been constant since it started?"      Pretty constant 4. SEVERITY: "How bad is your breathing?" (e.g., mild, moderate, severe)    - MILD: No SOB at rest, mild SOB with walking, speaks normally in sentences, can lie down, no retractions, pulse < 100.    - MODERATE: SOB at rest, SOB with minimal exertion and prefers to sit, cannot lie down flat, speaks in phrases, mild retractions, audible wheezing, pulse 100-120.    - SEVERE: Very SOB at rest, speaks in single words, struggling to breathe, sitting hunched forward, retractions, pulse > 120      Moderate to severe per pt 5. RECURRENT SYMPTOM: "Have you had difficulty breathing before?" If Yes, ask: "When was the last time?" and "What happened that time?"      yes 6. CARDIAC HISTORY: "Do you have any history of heart disease?" (e.g., heart attack, angina, bypass surgery, angioplasty)      HTN on meds 7. LUNG HISTORY: "Do you have any history of lung disease?"  (e.g., pulmonary embolus, asthma, emphysema)     asthma 9. OTHER SYMPTOMS: "Do you have any other symptoms? (e.g., dizziness, runny nose, cough, chest pain, fever)     Feel like heart's trying to race, won't say pain in chest just feels like something pushing in on chest like pressure on the chest maybe 2-3 min Don't have coughing until do nebulizer bc it opens things up  Protocols used: Breathing Difficulty-A-AH

## 2024-01-02 NOTE — Patient Instructions (Addendum)
 ICD-10-CM   1. Acute exacerbation of extrinsic asthma  J45.901     2. History of influenza  Z87.09       Fatigue, cough, increased nebulizer use, wheezing and chest tightness all seem very consistent with post influenza asthma exacerbation  Plan - Take prednisone 40 mg daily x 2 days, then 20mg  daily x 2 days, then 10mg  daily x 2 days, then 5mg  daily x 2 days and stop -Continue other asthma and allergy medications as before without change -Do spirometry pre and postbronchodilator in 3 months   Follow-up - Please establish with Dr. Durel Salts who practice asthma specialist within the next 3 months

## 2024-01-02 NOTE — Progress Notes (Signed)
 OV 01/02/2024  Subjective:  Patient ID: Carol Acosta, female , DOB: [birth year] , age 63 y.o. , MRN: [id number] , ADDRESS: [address] [place] [address] PCP [name], MD Patient Care Team: [name], MD as PCP - General (Internal Medicine) Patton Salles, MD as Consulting Physician (Obstetrics and Gynecology)  This Provider for this visit: Treatment Team:  Attending Provider: Kalman Shan, MD    01/02/2024 -   Chief Complaint  Patient presents with   Acute Visit    12/15/23- started with increased SOB a few hours after had allergy shot. Tested pos for Flu A 12/24/23 while out of town in [place]. She was seen at UC there- given tamiflu and started on augmentin. She feels fatigue and pressure in her chest. She also c/o wheezing and has minimal cough with yellow sputum.      HPI Carol Acosta 63 y.o. -acute visit for former Dr. Elige Radon Icard patient.  He used to see her for asthma.  She says she has lifelong allergies and sees the allergy clinic at [facility] and she is on allergy shots.  In 2021 Dr. Tonia Brooms started on Dupixent.  She states most recently on December 15, 2023 she got her allergy shots and then 4 hours later became acutely short of breath.  She talk something about the levels of allergy.  Nevertheless she restarted taking acute albuterol use.  The next day and the following day she had to take prednisone.  The following week she then went to [facility] to visit a friend.  During this time she was still taking Augmentin and resorting to taking prednisone daily.  Then on Sunday, December 24, 2023 she had acute fever and tested herself positive for flu A on over-the-counter [facility] test.  She was also wheezing.  She went to urgent care got Tamiflu and 10 more days of Augmentin [currently has 2 more days left].  She has been afebrile since December 26, 2023.  She completed Tamiflu December 28, 2023 but since then just feeling fatigued having yellow sputum having  wheezing using nebulizer more than usual.  Baseline use albuterol as negligence/0.  Now using nebulizers at least 3 times a day.  This chest tightness with there is no fever.  Therefore she is here because she wants to feel better.  Asthma Control Test ACT Total Score  07/09/2020 11:42 AM 18      No results found for: "NITRICOXIDE"   PFT     Latest Ref Rng & Units 02/12/2020    9:58 AM  PFT Results  FVC-Pre L 3.37  P  FVC-Predicted Pre % 95  P  FVC-Post L 3.39  P  FVC-Predicted Post % 95  P  Pre FEV1/FVC % % 73  P  Post FEV1/FCV % % 74  P  FEV1-Pre L 2.47  P  FEV1-Predicted Pre % 89  P  FEV1-Post L 2.51  P  DLCO uncorrected ml/min/mmHg 25.74  P  DLCO UNC% % 120  P  DLCO corrected ml/min/mmHg 24.25  P  DLCO COR %Predicted % 113  P  DLVA Predicted % 115  P  TLC L 5.51  P  TLC % Predicted % 104  P  RV % Predicted % 104  P    P Preliminary result       LAB RESULTS last 96 hours No results found.       has a past medical history of Allergic rhinitis, Asthma,  Hiatal hernia (06/2020), History of COVID-19 (06/2019), Hyperlipidemia, Hypertension, Osteopenia, and Pre-diabetes.   reports that she has never smoked. She has never been exposed to tobacco smoke. She has never used smokeless tobacco.  Past Surgical History:  Procedure Laterality Date   CESAREAN SECTION  2000   CHOLECYSTECTOMY N/A 06/23/2023   Procedure: LAPAROSCOPIC CHOLECYSTECTOMY;  Surgeon: Moise Boring, MD;  Location: [facility];  Service: General;  Laterality: N/A;   ENDOMETRIAL ABLATION     NASAL SINUS SURGERY  1989    Allergies  Allergen Reactions   Advair Diskus [Fluticasone-Salmeterol] Itching and Anxiety    Patient states that she felt like she was going crawl out of her skin.    Immunization History  Administered Date(s) Administered   Influenza Inj Mdck Quad Pf 07/14/2023   Influenza Inj Mdck Quad With Preservative 07/10/2017   Influenza, High Dose Seasonal PF 10/12/2018, 08/30/2019,  11/10/2020   Influenza,inj,Quad PF,6+ Mos 07/11/2019   Influenza-Unspecified 07/10/2016   PFIZER(Purple Top)SARS-COV-2 Vaccination 12/24/2019, 01/15/2020, 08/14/2020, 07/06/2021    Family History  Problem Relation Age of Onset   Asthma Mother    Allergies Mother    Hypertension Mother    Thyroid disease Mother    COPD Maternal Grandmother    Hypertension Maternal Grandmother    Allergic rhinitis Brother    Dementia Brother    Allergic rhinitis Brother    Breast cancer Neg Hx      Current Outpatient Medications:    albuterol (PROVENTIL) (2.5 MG/3ML) 0.083% nebulizer solution, Take 3 mLs (2.5 mg total) by nebulization every 6 (six) hours as needed for wheezing or shortness of breath., Disp: 75 mL, Rfl: 11   albuterol (VENTOLIN HFA) 108 (90 Base) MCG/ACT inhaler, Inhale 1 puff into the lungs as needed., Disp: 18 g, Rfl: 6   azelastine (OPTIVAR) 0.05 % ophthalmic solution, Place 1 drop into both eyes daily as needed (allergies)., Disp: , Rfl:    Cholecalciferol (VITAMIN D) 125 MCG (5000 UT) CAPS, Take 5,000 Units by mouth 4 (four) times a week., Disp: , Rfl:    clotrimazole-betamethasone (LOTRISONE) cream, Apply 1 application  topically daily as needed (on cesarean scar)., Disp: , Rfl:    desloratadine (CLARINEX) 5 MG tablet, Take 1 tablet (5 mg total) by mouth daily. (Patient taking differently: Take 5 mg by mouth daily as needed (rhinitis).), Disp: 90 tablet, Rfl: 3   DUPIXENT 300 MG/2ML SOAJ, INJECT 1 PEN UNDER THE SKIN EVERY 14 DAYS., Disp: 12 mL, Rfl: 3   EPINEPHrine 0.3 mg/0.3 mL IJ SOAJ injection, Inject 0.3 mg into the muscle as needed for anaphylaxis., Disp: , Rfl:    estradiol (ESTRACE VAGINAL) 0.1 MG/GM vaginal cream, APPLY PEA-SIZE AMOUNT TO URETHRA TWICE A WEEK AT BEDTIME, Disp: 42.5 g, Rfl: 1   Estradiol (VAGIFEM) 10 MCG TABS vaginal tablet, Place 1 tablet (10 mcg total) vaginally 2 (two) times a week., Disp: 24 tablet, Rfl: 3   estrogen, conjugated,-medroxyprogesterone  (PREMPRO) 0.3-1.5 MG tablet, Take 1 tablet by mouth daily., Disp: 90 tablet, Rfl: 3   fluticasone furoate-vilanterol (BREO ELLIPTA) 200-25 MCG/ACT AEPB, Inhale 1 puff into the lungs daily., Disp: 90 each, Rfl: 3   montelukast (SINGULAIR) 10 MG tablet, Take 1 tablet (10 mg total) by mouth at bedtime., Disp: 30 tablet, Rfl: 11   olmesartan-hydrochlorothiazide (BENICAR HCT) 40-12.5 MG tablet, Take 1 tablet by mouth daily., Disp: , Rfl:    Polyethyl Glyc-Propyl Glyc PF (SYSTANE ULTRA PF) 0.4-0.3 % SOLN, Place 1 drop into both eyes as  needed (dry eyes)., Disp: , Rfl:    predniSONE (DELTASONE) 10 MG tablet, Take prednisone 40 mg daily x 2 days, then 20mg  daily x 2 days, then 10mg  daily x 2 days, then 5mg  daily x 2 days and stop, Disp: 15 tablet, Rfl: 0   Probiotic Product (PHILLIPS COLON HEALTH) CAPS, Take 1 capsule by mouth every other day., Disp: , Rfl:    rosuvastatin (CRESTOR) 10 MG tablet, Take 5 mg by mouth every evening., Disp: , Rfl:    Spacer/Aero-Holding Chambers (AEROCHAMBER MV) inhaler, Use as instructed, Disp: 1 each, Rfl: 0   azelastine (ASTELIN) 0.1 % nasal spray, Place 2 sprays into both nostrils 2 (two) times daily. Use in each nostril as directed (Patient not taking: Reported on 01/02/2024), Disp: 30 mL, Rfl: 12      Objective:   Vitals:   01/02/24 0906 01/02/24 0907  BP:  118/66  Pulse: 79   Temp:  98.5 F (36.9 C)  TempSrc:  Oral  SpO2: 99%   Weight:  144 lb 6.4 oz (65.5 kg)  Height:  5' 5.5" (1.664 m)    Estimated body mass index is 23.66 kg/m as calculated from the following:   Height as of this encounter: 5' 5.5" (1.664 m).   Weight as of this encounter: 144 lb 6.4 oz (65.5 kg).  @WEIGHTCHANGE @  [employer]   01/02/24 0907  Weight: 144 lb 6.4 oz (65.5 kg)     Physical Exam   General: No distress. Looks stable O2 at rest: no Cane present: no Sitting in wheel chair: no Frail: no Obese: no Neuro: Alert and Oriented x 3. GCS 15. Speech normal Psych:  Pleasant Resp:  Barrel Chest - no.  Wheeze - UL wheeze, Crackles - no, No overt respiratory distress CVS: Normal heart sounds. Murmurs - no Ext: Stigmata of Connective Tissue Disease - no HEENT: Normal upper airway. PEERL +. No post nasal drip        Assessment:       ICD-10-CM   1. Acute exacerbation of extrinsic asthma  J45.901 Pulmonary function test    2. History of influenza  Z87.09 Pulmonary function test         Plan:     Patient Instructions     ICD-10-CM   1. Acute exacerbation of extrinsic asthma  J45.901     2. History of influenza  Z87.09       Fatigue, cough, increased nebulizer use, wheezing and chest tightness all seem very consistent with post influenza asthma exacerbation  Plan - Take prednisone 40 mg daily x 2 days, then 20mg  daily x 2 days, then 10mg  daily x 2 days, then 5mg  daily x 2 days and stop -Continue other asthma and allergy medications as before without change -Do spirometry pre and postbronchodilator in 3 months   Follow-up - Please establish with Dr. Durel Salts who practice asthma specialist within the next 3 months   FOLLOWUP Return in about 10 weeks (around 03/12/2024) for 30 min visit with Dr. Celine Mans for establishment of asthma.    SIGNATURE    Dr. Kalman Shan, M.D., F.C.C.P,  Pulmonary and Critical Care Medicine Staff Physician, [facility] Center Director - Interstitial Lung Disease  Program  Pulmonary Fibrosis [facility] at [facility] [place], [place], [address]  Pager: [phone], If no answer or between  15:00h - 7:00h: call [phone] Telephone: [phone]  9:40 AM 01/02/2024

## 2024-03-05 ENCOUNTER — Other Ambulatory Visit: Payer: Self-pay | Admitting: Internal Medicine

## 2024-03-05 MED ORDER — DESLORATADINE 5 MG PO TABS: 5.0000 mg | ORAL_TABLET | Freq: Every day | ORAL | 3 refills | Status: AC

## 2024-03-05 NOTE — Telephone Encounter (Signed)
 Copied from CRM [id number]. Topic: Clinical - Medication Refill >> Mar 05, 2024  3:21 PM Alverda Joe S wrote: Medication: desloratadine  (CLARINEX )  Has the patient contacted their pharmacy? Yes (Agent: If no, request that the patient contact the pharmacy for the refill. If patient does not wish to contact the pharmacy document the reason why and proceed with request.) (Agent: If yes, when and what did the pharmacy advise?)  This is the patient's preferred pharmacy:  [facility] [place], [place] - [address] [place] [address] Phone: [phone] Fax: [phone]   Is this the correct pharmacy for this prescription? Yes If no, delete pharmacy and type the correct one.   Has the prescription been filled recently? No  Is the patient out of the medication? Yes  Has the patient been seen for an appointment in the last year OR does the patient have an upcoming appointment? Yes  Can we respond through MyChart? Yes  Agent: Please be advised that Rx refills may take up to 3 business days. We ask that you follow-up with your pharmacy.

## 2024-03-06 NOTE — Progress Notes (Unsigned)
 New Patient Note  RE: Carol Acosta MRN: [id number] DOB: [birth year] Date of Office Visit: 03/07/2024  Consult requested by: Imelda Man, MD Primary care provider: Imelda Man, MD  Chief Complaint: No chief complaint on file.  History of Present Illness: I had the pleasure of seeing Carol Acosta for initial evaluation at the [facility] on 03/06/2024. She is a 63 y.o. female, who is referred here by Imelda Man, MD for the evaluation of transfer of care.  Discussed the use of AI scribe software for clinical note transcription with the patient, who gave verbal consent to proceed.  History of Present Illness             ***  Assessment and Plan: Carol Acosta is a 63 y.o. female with: ***  Assessment and Plan               No follow-ups on file.  No orders of the defined types were placed in this encounter.  Lab Orders  No laboratory test(s) ordered today    Other allergy  screening: Asthma: {Blank single:19197::"yes","no"} Rhino conjunctivitis: {Blank single:19197::"yes","no"} Food allergy : {Blank single:19197::"yes","no"} Medication allergy : {Blank single:19197::"yes","no"} Hymenoptera allergy : {Blank single:19197::"yes","no"} Urticaria: {Blank single:19197::"yes","no"} Eczema:{Blank single:19197::"yes","no"} History of recurrent infections suggestive of immunodeficency: {Blank single:19197::"yes","no"}  Diagnostics: Spirometry:  Tracings reviewed. Her effort: {Blank single:19197::"Good reproducible efforts.","It was hard to get consistent efforts and there is a question as to whether this reflects a maximal maneuver.","Poor effort, data can not be interpreted."} FVC: ***L FEV1: ***L, ***% predicted FEV1/FVC ratio: ***% Interpretation: {Blank single:19197::"Spirometry consistent with mild obstructive disease","Spirometry consistent with moderate obstructive disease","Spirometry consistent with severe obstructive disease","Spirometry  consistent with possible restrictive disease","Spirometry consistent with mixed obstructive and restrictive disease","Spirometry uninterpretable due to technique","Spirometry consistent with normal pattern","No overt abnormalities noted given today's efforts"}.  Please see scanned spirometry results for details.  Skin Testing: {Blank single:19197::"Select foods","Environmental allergy  panel","Environmental allergy  panel and select foods","Food allergy  panel","None","Deferred due to recent antihistamines use"}. *** Results discussed with patient/family.   Past Medical History: Patient Active Problem List   Diagnosis Date Noted  . Acute rhinosinusitis 02/08/2022  . Laryngitis 02/08/2022  . Hives 01/07/2021  . Severe persistent asthma without complication 01/07/2021  . Seasonal allergies 01/07/2021  . History of COVID-19 01/07/2021  . Wheezing 10/08/2020  . Allergic rhinitis 02/13/2020  . Essential hypertension 07/20/2019  . Mixed hyperlipidemia 07/20/2019  . Paresthesia 07/20/2019  . Exertional dyspnea 07/20/2019  . Palpitations 07/18/2019  . Severe persistent asthma with acute bronchitis and acute exacerbation 05/24/2011   Past Medical History:  Diagnosis Date  . Allergic rhinitis   . Asthma   . Hiatal hernia 06/2020  . History of COVID-19 06/2019  . Hyperlipidemia   . Hypertension   . Osteopenia   . Pre-diabetes    Past Surgical History: Past Surgical History:  Procedure Laterality Date  . CESAREAN SECTION  2000  . CHOLECYSTECTOMY N/A 06/23/2023   Procedure: LAPAROSCOPIC CHOLECYSTECTOMY;  Surgeon: Cannon Champion, MD;  Location: [facility];  Service: General;  Laterality: N/A;  . ENDOMETRIAL ABLATION    . NASAL SINUS SURGERY  1989   Medication List:  Current Outpatient Medications  Medication Sig Dispense Refill  . albuterol  (PROVENTIL ) (2.5 MG/3ML) 0.083% nebulizer solution Take 3 mLs (2.5 mg total) by nebulization every 6 (six) hours as needed for wheezing or  shortness of breath. 75 mL 11  . albuterol  (VENTOLIN  HFA) 108 (90 Base) MCG/ACT inhaler Inhale 1 puff into the lungs as needed. 18 g  6  . azelastine  (ASTELIN ) 0.1 % nasal spray Place 2 sprays into both nostrils 2 (two) times daily. Use in each nostril as directed (Patient not taking: Reported on 01/02/2024) 30 mL 12  . azelastine  (OPTIVAR ) 0.05 % ophthalmic solution Place 1 drop into both eyes daily as needed (allergies).    . Cholecalciferol (VITAMIN D) 125 MCG (5000 UT) CAPS Take 5,000 Units by mouth 4 (four) times a week.    . clotrimazole-betamethasone (LOTRISONE) cream Apply 1 application  topically daily as needed (on cesarean scar).    . desloratadine  (CLARINEX ) 5 MG tablet Take 1 tablet (5 mg total) by mouth daily. 90 tablet 3  . DUPIXENT  300 MG/2ML SOAJ INJECT 1 PEN UNDER THE SKIN EVERY 14 DAYS. 12 mL 3  . EPINEPHrine  0.3 mg/0.3 mL IJ SOAJ injection Inject 0.3 mg into the muscle as needed for anaphylaxis.    . estradiol  (ESTRACE  VAGINAL) 0.1 MG/GM vaginal cream APPLY PEA-SIZE AMOUNT TO URETHRA TWICE A WEEK AT BEDTIME 42.5 g 1  . Estradiol  (VAGIFEM ) 10 MCG TABS vaginal tablet Place 1 tablet (10 mcg total) vaginally 2 (two) times a week. 24 tablet 3  . estrogen, conjugated,-medroxyprogesterone (PREMPRO ) 0.3-1.5 MG tablet Take 1 tablet by mouth daily. 90 tablet 3  . fluticasone  furoate-vilanterol (BREO ELLIPTA ) 200-25 MCG/ACT AEPB Inhale 1 puff into the lungs daily. 90 each 3  . montelukast  (SINGULAIR ) 10 MG tablet Take 1 tablet (10 mg total) by mouth at bedtime. 30 tablet 11  . olmesartan-hydrochlorothiazide (BENICAR HCT) 40-12.5 MG tablet Take 1 tablet by mouth daily.    . Polyethyl Glyc-Propyl Glyc PF (SYSTANE ULTRA PF) 0.4-0.3 % SOLN Place 1 drop into both eyes as needed (dry eyes).    . predniSONE  (DELTASONE ) 10 MG tablet Take prednisone  40 mg daily x 2 days, then 20mg  daily x 2 days, then 10mg  daily x 2 days, then 5mg  daily x 2 days and stop 15 tablet 0  . Probiotic Product (PHILLIPS  COLON HEALTH) CAPS Take 1 capsule by mouth every other day.    . rosuvastatin  (CRESTOR ) 10 MG tablet Take 5 mg by mouth every evening.    Aaron Aas Spacer/Aero-Holding Chambers (AEROCHAMBER MV) inhaler Use as instructed 1 each 0   No current facility-administered medications for this visit.   Allergies: Allergies  Allergen Reactions  . Advair  Diskus [Fluticasone -Salmeterol] Itching and Anxiety    Patient states that she felt like she was going crawl out of her skin.   Social History: Social History   Socioeconomic History  . Marital status: Married    Spouse name: Not on file  . Number of children: 3  . Years of education: Not on file  . Highest education level: Not on file  Occupational History  . 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 Smoking status: Never    Passive exposure: Never  . Smokeless tobacco: Never  Vaping Use  . Vaping status: Never Used  Substance and Sexual Activity  . Alcohol use: Not Currently  . Drug use: No  . Sexual activity: Not Currently    Birth control/protection: None    Comment: first intercourse>17, less than 5 partners, no DES exposure  Other Topics Concern  . Not on file  Social History Narrative  . Not on file   Social Drivers of Health   Financial Resource Strain: Not on file  Food Insecurity: Not on file  Transportation Needs: Not on file  Physical Activity: Not on file  Stress: Not on file  Social Connections: Not on file   Lives in a ***. Smoking: *** Occupation: ***  Environmental HistorySurveyor, minerals in the house: Copywriter, advertising in the family room: {Blank single:19197::"yes","no"} Carpet in the bedroom: {Blank single:19197::"yes","no"} Heating: {Blank single:19197::"electric","gas","heat pump"} Cooling: {Blank single:19197::"central","window","heat pump"} Pet: {Blank single:19197::"yes ***","no"}  Family History: Family History  Problem Relation Age of Onset  . Asthma Mother   .  Allergies Mother   . Hypertension Mother   . Thyroid disease Mother   . COPD Maternal Grandmother   . Hypertension Maternal Grandmother   . Allergic rhinitis Brother   . Dementia Brother   . Allergic rhinitis Brother   . Breast cancer Neg Hx    Problem                               Relation Asthma                                   *** Eczema                                *** Food allergy                           *** Allergic rhino conjunctivitis     ***  Review of Systems  Constitutional:  Negative for appetite change, chills, fever and unexpected weight change.  HENT:  Negative for congestion and rhinorrhea.   Eyes:  Negative for itching.  Respiratory:  Negative for cough, chest tightness, shortness of breath and wheezing.   Cardiovascular:  Negative for chest pain.  Gastrointestinal:  Negative for abdominal pain.  Genitourinary:  Negative for difficulty urinating.  Skin:  Negative for rash.  Neurological:  Negative for headaches.   Objective: There were no vitals taken for this visit. There is no height or weight on file to calculate BMI. Physical Exam Vitals and nursing note reviewed.  Constitutional:      Appearance: Normal appearance. She is well-developed.  HENT:     Head: Normocephalic and atraumatic.     Right Ear: Tympanic membrane and external ear normal.     Left Ear: Tympanic membrane and external ear normal.     Nose: Nose normal.     Mouth/Throat:     Mouth: Mucous membranes are moist.     Pharynx: Oropharynx is clear.  Eyes:     Conjunctiva/sclera: Conjunctivae normal.  Cardiovascular:     Rate and Rhythm: Normal rate and regular rhythm.     Heart sounds: Normal heart sounds. No murmur heard.    No friction rub. No gallop.  Pulmonary:     Effort: Pulmonary effort is normal.     Breath sounds: Normal breath sounds. No wheezing, rhonchi or rales.  Musculoskeletal:     Cervical back: Neck supple.  Skin:    General: Skin is warm.     Findings: No  rash.  Neurological:     Mental Status: She is alert and oriented to person, place, and time.  Psychiatric:        Behavior: Behavior normal.  The plan was reviewed with the patient/family, and all questions/concerned were addressed.  It was my pleasure to see Carol Acosta today and participate in her care. Please feel free to contact me  with any questions or concerns.  Sincerely,  Eudelia Hero, DO Allergy  & Immunology  [facility]  [place] office: [phone] [facility] office: [phone]

## 2024-03-07 ENCOUNTER — Encounter: Payer: Self-pay | Admitting: Allergy

## 2024-03-07 ENCOUNTER — Ambulatory Visit: Payer: Self-pay | Admitting: Allergy

## 2024-03-07 VITALS — BP 118/68 | HR 71 | Temp 98.6°F | Resp 21 | Ht 65.5 in | Wt 147.0 lb

## 2024-03-07 DIAGNOSIS — R12 Heartburn: Secondary | ICD-10-CM

## 2024-03-07 DIAGNOSIS — H1013 Acute atopic conjunctivitis, bilateral: Secondary | ICD-10-CM

## 2024-03-07 DIAGNOSIS — J3089 Other allergic rhinitis: Secondary | ICD-10-CM

## 2024-03-07 DIAGNOSIS — J4550 Severe persistent asthma, uncomplicated: Secondary | ICD-10-CM

## 2024-03-07 DIAGNOSIS — B999 Unspecified infectious disease: Secondary | ICD-10-CM | POA: Diagnosis not present

## 2024-03-07 DIAGNOSIS — T781XXD Other adverse food reactions, not elsewhere classified, subsequent encounter: Secondary | ICD-10-CM

## 2024-03-07 MED ORDER — OMEPRAZOLE 40 MG PO CPDR: 40.0000 mg | DELAYED_RELEASE_CAPSULE | Freq: Every day | ORAL | 1 refills | Status: AC

## 2024-03-07 MED ORDER — RYALTRIS 665-25 MCG/ACT NA SUSP
1.0000 | Freq: Two times a day (BID) | NASAL | 5 refills | Status: AC
Start: 2024-03-07 — End: ?

## 2024-03-07 NOTE — Patient Instructions (Addendum)
 Environmental allergies Continue environmental control measures.  Clarinex  once a day.  Continue Singulair  (montelukast ) 10mg  daily at night. Start Ryaltris (olopatadine + mometasone nasal spray combination) 1-2 sprays per nostril twice a day.  This replaces your other nasal sprays. If this works well for you, then have pharmacy ship the medication to your home - prescription already sent in.  Nasal saline spray (i.e., Simply Saline) or nasal saline lavage (i.e., NeilMed) is recommended as needed and prior to medicated nasal sprays.  Recommend re-testing.  Consider referral to ENT to look at sinus anatomy in the future.   Return for allergy  skin testing. Will make additional recommendations based on results. If significant positives will recommend continuing allergy  injections. Make sure you don't take any antihistamines for 3 days before the skin testing appointment. Don't put any lotion on the back and arms on the day of testing.  Plan on being here for 30-60 minutes.   Infections Keep track of infections and antibiotics use. Get bloodwork next to look at immune system.   Asthma Daily controller medication(s): continue Breo 200mcg 1 puff once a day and rinse mouth after each use.  Continue Dupixent  injections every 2 weeks at home as before for now.  Recommend switching to another biologics depending on bloodwork results.  May use albuterol  rescue inhaler 2 puffs or nebulizer every 4 to 6 hours as needed for shortness of breath, chest tightness, coughing, and wheezing. May use albuterol  rescue inhaler 2 puffs 5 to 15 minutes prior to strenuous physical activities. Monitor frequency of use - if you need to use it more than twice per week on a consistent basis let us  know.  Breathing control goals:  Full participation in all desired activities (may need albuterol  before activity) Albuterol  use two times or less a week on average (not counting use with activity) Cough interfering with  sleep two times or less a month Oral steroids no more than once a year No hospitalizations   Possible reflux See handout for lifestyle and dietary modifications. Start omeprazole 40mg  once day - nothing to eat or drink for 20-30 minutes afterwards.   Follow up in 1 week for skin testing.

## 2024-03-14 ENCOUNTER — Ambulatory Visit: Admitting: Allergy

## 2024-03-14 DIAGNOSIS — B999 Unspecified infectious disease: Secondary | ICD-10-CM

## 2024-03-14 DIAGNOSIS — J3089 Other allergic rhinitis: Secondary | ICD-10-CM | POA: Diagnosis not present

## 2024-03-14 DIAGNOSIS — T781XXD Other adverse food reactions, not elsewhere classified, subsequent encounter: Secondary | ICD-10-CM

## 2024-03-14 DIAGNOSIS — H1013 Acute atopic conjunctivitis, bilateral: Secondary | ICD-10-CM

## 2024-03-14 DIAGNOSIS — J4550 Severe persistent asthma, uncomplicated: Secondary | ICD-10-CM

## 2024-03-14 DIAGNOSIS — R12 Heartburn: Secondary | ICD-10-CM

## 2024-03-14 MED ORDER — EPINEPHRINE 0.3 MG/0.3ML IJ SOAJ: 0.3000 mg | INTRAMUSCULAR | 1 refills | Status: AC | PRN

## 2024-03-14 NOTE — Patient Instructions (Addendum)
 Today's skin testing positive to trees, mold, dust mites, cat and dog. Borderline to sesame and milk.  Monitor symptoms after consuming above foods.  Results given.  Environmental allergies Start environmental control measures as below. Clarinex  once a day.  Continue Singulair  (montelukast ) 10mg  daily at night. Start Ryaltris  (olopatadine + mometasone nasal spray combination) 1-2 sprays per nostril twice a day.  This replaces your other nasal sprays. If this works well for you, then have pharmacy ship the medication to your home - prescription already sent in.  Nasal saline spray (i.e., Simply Saline) or nasal saline lavage (i.e., NeilMed) is recommended as needed and prior to medicated nasal sprays. Consider referral to ENT to look at sinus anatomy in the future.  Restart allergy  injections - 2 injections.  Make first shot appointment in the [place] office.  Had a detailed discussion with patient/family that clinical history is suggestive of allergic rhinitis, and may benefit from allergy  immunotherapy (AIT). Discussed in detail regarding the dosing, schedule, side effects (mild to moderate local allergic reaction and rarely systemic allergic reactions including anaphylaxis), and benefits (significant improvement in nasal symptoms, seasonal flares of asthma) of immunotherapy with the patient. There is significant time commitment involved with allergy  shots, which includes weekly immunotherapy injections for first 9-12 months and then biweekly to monthly injections for 3-5 years. Consent was signed. I have prescribed epinephrine  injectable device and demonstrated proper use. For mild symptoms you can take over the counter antihistamines (zyrtec 10mg  to 20mg ) and monitor symptoms closely.  If symptoms worsen or if you have severe symptoms including breathing issues, throat closure, significant swelling, whole body hives, severe diarrhea and vomiting, lightheadedness then use epinephrine  and seek  immediate medical care afterwards. Emergency action plan given.  Infections Keep track of infections and antibiotics use. We may get bloodwork at next visit.   Asthma Daily controller medication(s): continue Breo 200mcg 1 puff once a day and rinse mouth after each use.  Continue Dupixent  injections every 2 weeks at home as before for now.  Sign consent form to take over Dupixent . [name] will be in touch with you.  May use albuterol  rescue inhaler 2 puffs or nebulizer every 4 to 6 hours as needed for shortness of breath, chest tightness, coughing, and wheezing. May use albuterol  rescue inhaler 2 puffs 5 to 15 minutes prior to strenuous physical activities. Monitor frequency of use - if you need to use it more than twice per week on a consistent basis let us  know.  Breathing control goals:  Full participation in all desired activities (may need albuterol  before activity) Albuterol  use two times or less a week on average (not counting use with activity) Cough interfering with sleep two times or less a month Oral steroids no more than once a year No hospitalizations   Possible reflux Continue handout for lifestyle and dietary modifications. Continue omeprazole  40mg  once day for 1 month then decrease to 20mg  once a day - nothing to eat or drink for 20-30 minutes afterwards.  Return in about 3 months (around 06/14/2024). Or sooner if needed.   Reducing Pollen Exposure Pollen seasons: trees (spring), grass (summer) and ragweed/weeds (fall). Keep windows closed in your home and car to lower pollen exposure.  Install air conditioning in the bedroom and throughout the house if possible.  Avoid going out in dry windy days - especially early morning. Pollen counts are highest between 5 - 10 AM and on dry, hot and windy days.  Save outside activities for late afternoon  or after a heavy rain, when pollen levels are lower.  Avoid mowing of grass if you have grass pollen allergy . Be aware that pollen  can also be transported indoors on people and pets.  Dry your clothes in an automatic dryer rather than hanging them outside where they might collect pollen.  Rinse hair and eyes before bedtime.  Control of House Dust Mite Allergen Dust mite allergens are a common trigger of allergy  and asthma symptoms. While they can be found throughout the house, these microscopic creatures thrive in warm, humid environments such as bedding, upholstered furniture and carpeting. Because so much time is spent in the bedroom, it is essential to reduce mite levels there.  Encase pillows, mattresses, and box springs in special allergen-proof fabric covers or airtight, zippered plastic covers.  Bedding should be washed weekly in hot water (130 F) and dried in a hot dryer. Allergen-proof covers are available for comforters and pillows that can't be regularly washed.  Wash the allergy -proof covers every few months. Minimize clutter in the bedroom. Keep pets out of the bedroom.  Keep humidity less than 50% by using a dehumidifier or air conditioning. You can buy a humidity measuring device called a hygrometer to monitor this.  If possible, replace carpets with hardwood, linoleum, or washable area rugs. If that's not possible, vacuum frequently with a vacuum that has a HEPA filter. Remove all upholstered furniture and non-washable window drapes from the bedroom. Remove all non-washable stuffed toys from the bedroom.  Wash stuffed toys weekly.  Pet Allergen Avoidance: Contrary to popular opinion, there are no "hypoallergenic" breeds of dogs or cats. That is because people are not allergic to an animal's hair, but to an allergen found in the animal's saliva, dander (dead skin flakes) or urine. Pet allergy  symptoms typically occur within minutes. For some people, symptoms can build up and become most severe 8 to 12 hours after contact with the animal. People with severe allergies can experience reactions in public places if  dander has been transported on the pet owners' clothing. Keeping an animal outdoors is only a partial solution, since homes with pets in the yard still have higher concentrations of animal allergens. Before getting a pet, ask your allergist to determine if you are allergic to animals. If your pet is already considered part of your family, try to minimize contact and keep the pet out of the bedroom and other rooms where you spend a great deal of time. As with dust mites, vacuum carpets often or replace carpet with a hardwood floor, tile or linoleum. High-efficiency particulate air (HEPA) cleaners can reduce allergen levels over time. While dander and saliva are the source of cat and dog allergens, urine is the source of allergens from rabbits, hamsters, mice and [place] pigs; so ask a non-allergic family member to clean the animal's cage. If you have a pet allergy , talk to your allergist about the potential for allergy  immunotherapy (allergy  shots). This strategy can often provide long-term relief.  Mold Control Mold and fungi can grow on a variety of surfaces provided certain temperature and moisture conditions exist.  Outdoor molds grow on plants, decaying vegetation and soil. The major outdoor mold, Alternaria and Cladosporium, are found in very high numbers during hot and dry conditions. Generally, a late summer - fall peak is seen for common outdoor fungal spores. Rain will temporarily lower outdoor mold spore count, but counts rise rapidly when the rainy period ends. The most important indoor molds are Aspergillus and Penicillium.  Dark, humid and poorly ventilated basements are ideal sites for mold growth. The next most common sites of mold growth are the bathroom and the kitchen. Outdoor (Seasonal) Mold Control Use air conditioning and keep windows closed. Avoid exposure to decaying vegetation. Avoid leaf raking. Avoid grain handling. Consider wearing a face mask if working in moldy areas.   Indoor (Perennial) Mold Control  Maintain humidity below 50%. Get rid of mold growth on hard surfaces with water, detergent and, if necessary, 5% bleach (do not mix with other cleaners). Then dry the area completely. If mold covers an area more than 10 square feet, consider hiring an indoor environmental professional. For clothing, washing with soap and water is best. If moldy items cannot be cleaned and dried, throw them away. Remove sources e.g. contaminated carpets. Repair and seal leaking roofs or pipes. Using dehumidifiers in damp basements may be helpful, but empty the water and clean units regularly to prevent mildew from forming. All rooms, especially basements, bathrooms and kitchens, require ventilation and cleaning to deter mold and mildew growth. Avoid carpeting on concrete or damp floors, and storing items in damp areas.

## 2024-03-14 NOTE — Progress Notes (Unsigned)
 Skin testing note  RE: Carol Acosta Acosta MRN: [id number] DOB: Dec 15, 1960 Date of Office Visit: 03/14/2024  Referring provider: Imelda Man, MD Primary care provider: Imelda Man, MD  Chief Complaint: skin testing  History of Present Illness: I had the pleasure of seeing Carol Acosta Acosta for a skin testing visit at the [facility] on 03/15/2024. She is a 63 y.o. female, who is being followed for asthma, allergic rhinoconjunctivitis, recurrent infection and adverse food reaction. Her previous allergy  office visit was on 03/07/2024 with Carol Acosta Acosta. Today is a skin testing visit.   Discussed the use of AI scribe software for clinical note transcription with the patient, who gave verbal consent to proceed.    Her current allergy  management includes Clarinex  and Singulair , and she has recently started using Ryaltris  nasal spray. Despite these treatments, she continues to experience symptoms such as tingling and sneezing. She is considering restarting allergy  shots due to these ongoing symptoms.   Her asthma is managed with Dupixent . Previously, her asthma symptoms were exacerbated by acid reflux, but since starting omeprazole  40 mg daily she has only needed her rescue inhaler once. She has enough Dupixent  for three months and plans to continue with this regimen.  She inquires about the management of potential sinus infections and COVID-19. She prefers to see us  for sinus infections unless unavailable, in which case she would consider urgent care.     Assessment and Plan: Carol Acosta Acosta is a 63 y.o. female with: Other allergic rhinitis Allergic conjunctivitis of both eyes Past history - managed with allergy  shots, higher doses increase respiratory infections. Symptoms include itchy sensations and eyes. Clarinex  and azelastine  used. Ryaltris  effective but unavailable. Last skin testing in 2021 showed multiple positives. Last allergy  injection in April 2025 and wants to continue at a lower  dose as they are effective in controlling her symptoms.  Today's skin testing positive to trees, mold, dust mites, cat and dog. Start environmental control measures as below. Clarinex  once a day.  Continue Singulair  (montelukast ) 10mg  daily at night. Start Ryaltris  (olopatadine + mometasone nasal spray combination) 1-2 sprays per nostril twice a day.  This replaces your other nasal sprays. If this works well for you, then have pharmacy ship the medication to your home - prescription already sent in.  Nasal saline spray (i.e., Simply Saline) or nasal saline lavage (i.e., NeilMed) is recommended as needed and prior to medicated nasal sprays. Consider referral to ENT to look at sinus anatomy in the future.  Restart allergy  injections - 2 injections.  Make first shot appointment in the [place] office.  Had a detailed discussion with patient/family that clinical history is suggestive of allergic rhinitis, and may benefit from allergy  immunotherapy (AIT). Discussed in detail regarding the dosing, schedule, side effects (mild to moderate local allergic reaction and rarely systemic allergic reactions including anaphylaxis), and benefits (significant improvement in nasal symptoms, seasonal flares of asthma) of immunotherapy with the patient. There is significant time commitment involved with allergy  shots, which includes weekly immunotherapy injections for first 9-12 months and then biweekly to monthly injections for 3-5 years. Consent was signed. I have prescribed epinephrine  injectable device and demonstrated proper use. For mild symptoms you can take over the counter antihistamines (zyrtec 10mg  to 20mg ) and monitor symptoms closely.  If symptoms worsen or if you have severe symptoms including breathing issues, throat closure, significant swelling, whole body hives, severe diarrhea and vomiting, lightheadedness then use epinephrine  and seek immediate medical care afterwards. Emergency action plan  given.  Severe persistent asthma Past history - Increased exacerbations after the higher doses of allergy  immunotherapy. Dupixent  may be losing efficacy. Breo preferred due to fewer side effects. Frequent albuterol  and prednisone  use. Possible reflux contribution. 2025 spirometry was normal. Interim history - much better with PPI on board. Daily controller medication(s): continue Breo 200mcg 1 puff once a day and rinse mouth after each use.  Continue Dupixent  injections every 2 weeks at home as before for now.  Signed consent form to take over Dupixent . Carol Acosta Acosta will be in touch with you.  May use albuterol  rescue inhaler 2 puffs or nebulizer every 4 to 6 hours as needed for shortness of breath, chest tightness, coughing, and wheezing. May use albuterol  rescue inhaler 2 puffs 5 to 15 minutes prior to strenuous physical activities. Monitor frequency of use - if you need to use it more than twice per week on a consistent basis let us  know.    Recurrent infections Past history - Frequent sinus infections.  Keep track of infections and antibiotics use. We may get bloodwork at next visit.    Other adverse food reactions, not elsewhere classified, subsequent encounter Heartburn Past history - concerned about food allergies as she noted increased "mucous" production after meals which then exacerbate her asthma. Today's skin testing borderline to sesame and milk.  Monitor symptoms after consuming above foods. Continue handout for lifestyle and dietary modifications. Continue omeprazole  40mg  once day for 1 month then decrease to 20mg  once a day - nothing to eat or drink for 20-30 minutes afterwards.  Return in about 3 months (around 06/14/2024).  Meds ordered this encounter  Medications   EPINEPHrine  0.3 mg/0.3 mL IJ SOAJ injection    Sig: Inject 0.3 mg into the muscle as needed for anaphylaxis.    Dispense:  2 each    Refill:  1    May dispense generic/Mylan/Teva brand.   Lab Orders  No  laboratory test(s) ordered today    Diagnostics: Skin Testing: Environmental allergy  panel and select foods. Today's skin testing positive to trees, mold, dust mites, cat and dog. Borderline to sesame and milk.  Results discussed with patient/family.  Airborne Adult Perc - 03/14/24 0929     Time Antigen Placed 1610    Allergen Manufacturer Floyd Hutchinson    Location Back    Number of Test 55    1. Control-Buffer 50% Glycerol Negative    2. Control-Histamine 3+    3. Bahia Negative    4. French Southern Territories Negative    5. Johnson Negative    6. [place]  Blue Negative    7. Meadow Fescue Negative    8. Perennial Rye Negative    9. Timothy Negative    10. Ragweed Mix Negative    11. Cocklebur Negative    12. Plantain,  English Negative    13. Baccharis Negative    14. Dog Fennel Negative    15. Russian Thistle Negative    16. Lamb's Quarters Negative    17. Sheep Sorrell Negative    18. Rough Pigweed Negative    19. Marsh Elder, Rough Negative    20. Mugwort, Common Negative    21. Box, Elder 2+    22. Cedar, red Negative    23. Sweet Gum 3+    24. Pecan Pollen 2+    25. Pine Mix 2+    26. Walnut, Black Pollen 3+    27. Red Mulberry 2+    28. Ash Mix Negative    29. Birch  Mix 4+    30. Beech American 2+    31. Cottonwood, Guinea-Bissau Negative    32. Hickory, White 2+    33. Maple Mix Negative    34. Oak, [place] Mix 4+    35. Sycamore Eastern Negative    36. Alternaria Alternata Negative    37. Cladosporium Herbarum Negative    38. Aspergillus Mix Negative    39. Penicillium Mix Negative    40. Bipolaris Sorokiniana (Helminthosporium) Negative    41. Drechslera Spicifera (Curvularia) Negative    42. Mucor Plumbeus Negative    43. Fusarium Moniliforme 2+    44. Aureobasidium Pullulans (pullulara) Negative    45. Rhizopus Oryzae 2+    46. Botrytis Cinera 2+    47. Epicoccum Nigrum 2+    48. Phoma Betae Negative    49. Dust Mite Mix 3+    50. Cat Hair 10,000 BAU/ml Negative    51.   Dog Epithelia 3+    52. Mixed Feathers Negative    53. Horse Epithelia Negative    54. Cockroach, German Negative    55. Tobacco Leaf Negative             Intradermal - 03/14/24 1048     Time Antigen Placed 1048    Allergen Manufacturer Greer    Location Arm    Number of Test 12    Control Negative    Bahia Negative    French Southern Territories Negative    Johnson Negative    7 Grass Negative    Ragweed Mix Negative    Weed Mix Negative    Mold 1 Negative    Mold 2 Negative    Mold 3 Negative    Cat 2+    Cockroach Negative             Food Adult Perc - 03/14/24 1000     Time Antigen Placed 4098    Allergen Manufacturer Floyd Hutchinson    Location Back    Number of allergen test 9     Control-buffer 50% Glycerol Negative    Control-Histamine 3+    1. Peanut Negative    2. Soybean Negative    3. Wheat --   +/-   4. Sesame --   +/-   5. Milk, Cow Negative    6. Casein Negative    7. Egg White, Chicken Negative    8. Shellfish Mix Negative    9. Fish Mix Negative             Previous notes and tests were reviewed. The plan was reviewed with the patient/family, and all questions/concerned were addressed.  It was my pleasure to see Carol Acosta Acosta today and participate in her care. Please feel free to contact me with any questions or concerns.  Sincerely,  Eudelia Hero, DO Allergy  & Immunology  [facility]  [place] office: [phone] [facility] office: [phone]

## 2024-03-15 ENCOUNTER — Encounter: Payer: Self-pay | Admitting: Allergy

## 2024-03-18 ENCOUNTER — Other Ambulatory Visit: Payer: Self-pay | Admitting: Allergy

## 2024-03-18 DIAGNOSIS — J3089 Other allergic rhinitis: Secondary | ICD-10-CM

## 2024-03-18 NOTE — Progress Notes (Signed)
 Aeroallergen Immunotherapy  Ordering Provider: Dr. Eudelia Hero  Patient Details Name: Carol Acosta MRN: [id number] Date of Birth: May 02, 1961  Order 2 of 2  Vial Label: M-C-D  0.2 ml (Volume)  1:10 Concentration -- Fusarium moniliforme 0.2 ml (Volume)  1:40 Concentration -- Botrytis cinerea 0.2 ml (Volume)  1:40 Concentration -- Epicoccum nigrum 0.5 ml (Volume)  1:10 Concentration -- Cat Hair 0.5 ml (Volume)  1:10 Concentration -- Dog Epithelia   1.6  ml Extract Subtotal 3.4  ml Diluent  5.0  ml Maintenance Total  Schedule:  B Blue Vial (1:100,000): Schedule B (6 doses) Yellow Vial (1:10,000): Schedule B (6 doses) Green Vial (1:1,000): Schedule B (6 doses) Red Vial (1:100): Schedule A (14 doses)  Special Instructions: May come in 1-2 times a week during build up as tolerated. Once a week on red vial. Once she is on red vial #1 0.5cc go every 2 weeks, on red vial #2 0.5cc go every 4 weeks. May build up red vials faster (0.1, 0.3, 0.5).

## 2024-03-18 NOTE — Progress Notes (Signed)
 Aeroallergen Immunotherapy  Ordering Provider: Dr. Eudelia Hero  Patient Details Name: Carol Acosta MRN: [id number] Date of Birth: 1961/05/19  Order 1 of 2  Vial Label: T-Dm  0.5 ml (Volume)  1:20 Concentration -- Eastern 10 Tree Mix (also Sweet Gum) 0.2 ml (Volume)  1:20 Concentration -- Box Elder 0.2 ml (Volume)  1:10 Concentration -- Pecan Pollen 0.2 ml (Volume)  1:10 Concentration -- Pine Mix 0.2 ml (Volume)  1:20 Concentration -- Red Mulberry 0.2 ml (Volume)  1:20 Concentration -- Sweet Gum 0.2 ml (Volume)  1:20 Concentration -- Walnut, Black Pollen 0.5 ml (Volume)   AU Concentration -- Mite Mix (DF 5,000 & DP 5,000)   2.2  ml Extract Subtotal 2.8  ml Diluent  5.0  ml Maintenance Total  Schedule:  B Blue Vial (1:100,000): Schedule B (6 doses) Yellow Vial (1:10,000): Schedule B (6 doses) Green Vial (1:1,000): Schedule B (6 doses) Red Vial (1:100): Schedule A (14 doses)  Special Instructions: May come in 1-2 times a week during build up as tolerated. Once a week on red vial. Once she is on red vial #1 0.5cc go every 2 weeks, on red vial #2 0.5cc go every 4 weeks. May build up red vials faster (0.1, 0.3, 0.5).

## 2024-03-18 NOTE — Progress Notes (Signed)
 VIALS MADE 03-18-24

## 2024-03-19 DIAGNOSIS — J3081 Allergic rhinitis due to animal (cat) (dog) hair and dander: Secondary | ICD-10-CM | POA: Diagnosis not present

## 2024-03-26 ENCOUNTER — Telehealth: Payer: Self-pay | Admitting: *Deleted

## 2024-03-26 NOTE — Telephone Encounter (Signed)
 Called patient and advised when she needs refills of re-approval she can reach back out to me and I will take care of same

## 2024-03-26 NOTE — Telephone Encounter (Signed)
-----   Message from Trudy Fusi sent at 03/14/2024  2:58 PM EDT ----- Currently on Dupixent  300mg  every 2 weeks at home for asthma. This was initially prescribed by pulm but would like us  to take over. Patient states she still has about 3 months worth of medication. Please reach out to patient about continuing and if we need to do reapproval. ty

## 2024-04-05 ENCOUNTER — Ambulatory Visit (INDEPENDENT_AMBULATORY_CARE_PROVIDER_SITE_OTHER)

## 2024-04-05 DIAGNOSIS — J309 Allergic rhinitis, unspecified: Secondary | ICD-10-CM

## 2024-04-05 NOTE — Progress Notes (Signed)
 Immunotherapy   Patient Details  Name: Carol Acosta MRN: [id number] Date of Birth: August 24, 1961  04/05/2024  Carol Acosta started injections for T-DM and M-C-D. Patient received 0.05 ml out of both blue vials with an exp of 03/18/2025. Patient waited 30 minutes with no problems.  Following schedule: B  Frequency:2 times per week Epi-Pen:Epi-Pen Available  Consent signed and patient instructions given.   [name] 04/05/2024, 8:31 AM

## 2024-04-08 ENCOUNTER — Encounter

## 2024-04-08 ENCOUNTER — Ambulatory Visit: Admitting: Internal Medicine

## 2024-04-09 ENCOUNTER — Ambulatory Visit (INDEPENDENT_AMBULATORY_CARE_PROVIDER_SITE_OTHER)

## 2024-04-09 DIAGNOSIS — J309 Allergic rhinitis, unspecified: Secondary | ICD-10-CM

## 2024-04-15 ENCOUNTER — Other Ambulatory Visit: Payer: Self-pay | Admitting: Internal Medicine

## 2024-04-15 DIAGNOSIS — Z0000 Encounter for general adult medical examination without abnormal findings: Secondary | ICD-10-CM

## 2024-04-16 ENCOUNTER — Encounter: Payer: Self-pay | Admitting: Podiatry

## 2024-04-16 ENCOUNTER — Ambulatory Visit: Admitting: Podiatry

## 2024-04-16 ENCOUNTER — Ambulatory Visit (INDEPENDENT_AMBULATORY_CARE_PROVIDER_SITE_OTHER)

## 2024-04-16 DIAGNOSIS — M79671 Pain in right foot: Secondary | ICD-10-CM

## 2024-04-16 DIAGNOSIS — M7661 Achilles tendinitis, right leg: Secondary | ICD-10-CM | POA: Diagnosis not present

## 2024-04-16 MED ORDER — MELOXICAM 15 MG PO TABS
15.0000 mg | ORAL_TABLET | Freq: Every day | ORAL | 0 refills | Status: DC
Start: 2024-04-16 — End: 2024-07-22

## 2024-04-16 NOTE — Progress Notes (Signed)
  Subjective:  Patient ID: Carol Acosta, female    DOB: [birth year],   MRN: [id number]  Chief Complaint  Patient presents with   Ankle Pain    Patient states she has been having pain in the back of her right heel since April, patient states it hurts to walk a lot and her right ankle is sore to the touch and also very tender. No medication for pain     63 y.o. female presents for concern of right heel pain. Relates this has been ongoing since April. 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 She relates pain with first steps and after being on her feet all day. She has tried good feet inserts and shoes with heel lifts and this does seem to help but relates continued pain. Denies pain currently as recently had shingles shot and had taken some advil .  . Denies any other pedal complaints. Denies n/v/f/c.   Past Medical History:  Diagnosis Date   Allergic rhinitis    Asthma    Hiatal hernia 06/2020   History of COVID-19 06/2019   Hyperlipidemia    Hypertension    Osteopenia    Pre-diabetes     Objective:  Physical Exam: Vascular: DP/PT pulses 2/4 bilateral. CFT <3 seconds. Normal hair growth on digits. No edema.  Skin. No lacerations or abrasions bilateral feet.  Musculoskeletal: MMT 5/5 bilateral lower extremities in DF, PF, Inversion and Eversion. Deceased ROM in DF of ankle joint. Tender to insertion of achilles tendon with palpable spurring noted. Some pain proximally along the tendon . No pain currently to medial calcaneal tubercle or around PT tendon  Neurological: Sensation intact to light touch.   Assessment:   1. Tendonitis, Achilles, right      Plan:  Patient was evaluated and treated and all questions answered. X-rays reviewed and discussed with patient. No acute fractures or dislocations noted. Mild spurring noted at inferior calcaneus and posterior calcaneus with haglunds deformity noted. SABRA Pes cavus noted.  -Discussed  Achilles insertional tendonitis and treatment options with patient.  -Discussed stretching exercises. -Rx Meloxicam  provided BMP reviewed and wnl.  -Heel lifts provided and discussed proper shoewear. Continue with support she has.  -Discussed if no improvement will consider MRI/PT/EPAT/PRP injections.  -Patient to return to office in 6 weeks for recheck.    Asberry Failing, DPM

## 2024-04-16 NOTE — Patient Instructions (Signed)

## 2024-04-19 ENCOUNTER — Ambulatory Visit (INDEPENDENT_AMBULATORY_CARE_PROVIDER_SITE_OTHER)

## 2024-04-19 DIAGNOSIS — J309 Allergic rhinitis, unspecified: Secondary | ICD-10-CM | POA: Diagnosis not present

## 2024-04-22 ENCOUNTER — Ambulatory Visit (INDEPENDENT_AMBULATORY_CARE_PROVIDER_SITE_OTHER)

## 2024-04-22 DIAGNOSIS — J309 Allergic rhinitis, unspecified: Secondary | ICD-10-CM | POA: Diagnosis not present

## 2024-04-29 ENCOUNTER — Ambulatory Visit (INDEPENDENT_AMBULATORY_CARE_PROVIDER_SITE_OTHER)

## 2024-04-29 DIAGNOSIS — J309 Allergic rhinitis, unspecified: Secondary | ICD-10-CM | POA: Diagnosis not present

## 2024-05-03 ENCOUNTER — Ambulatory Visit

## 2024-05-03 DIAGNOSIS — J309 Allergic rhinitis, unspecified: Secondary | ICD-10-CM

## 2024-05-07 ENCOUNTER — Ambulatory Visit (INDEPENDENT_AMBULATORY_CARE_PROVIDER_SITE_OTHER)

## 2024-05-07 DIAGNOSIS — J309 Allergic rhinitis, unspecified: Secondary | ICD-10-CM | POA: Diagnosis not present

## 2024-05-14 ENCOUNTER — Ambulatory Visit (INDEPENDENT_AMBULATORY_CARE_PROVIDER_SITE_OTHER)

## 2024-05-14 DIAGNOSIS — J309 Allergic rhinitis, unspecified: Secondary | ICD-10-CM | POA: Diagnosis not present

## 2024-05-17 ENCOUNTER — Ambulatory Visit (INDEPENDENT_AMBULATORY_CARE_PROVIDER_SITE_OTHER)

## 2024-05-17 DIAGNOSIS — J309 Allergic rhinitis, unspecified: Secondary | ICD-10-CM | POA: Diagnosis not present

## 2024-05-24 ENCOUNTER — Telehealth: Payer: Self-pay | Admitting: Pharmacist

## 2024-05-24 NOTE — Telephone Encounter (Signed)
 Submitted a Prior Authorization RENEWAL request to [facility] for DUPIXENT  via CoverMyMeds. Will update once we receive a response.  Key: [id number]   Patient overdue to establish with new MD. Former [employer] patient. Please route to admin team once approved

## 2024-05-27 NOTE — Telephone Encounter (Signed)
 Received notification from [facility] regarding a prior authorization for DUPIXENT . Authorization has been APPROVED from 05/24/2024 to 05/24/2025. Approval letter sent to scan center.  Patient must continue to fill through [facility]: [phone]  Authorization # [id number]  Routing to scheduling team

## 2024-05-28 ENCOUNTER — Encounter: Payer: Self-pay | Admitting: Podiatry

## 2024-05-28 ENCOUNTER — Ambulatory Visit: Admitting: Podiatry

## 2024-05-28 DIAGNOSIS — M7661 Achilles tendinitis, right leg: Secondary | ICD-10-CM

## 2024-05-28 NOTE — Patient Instructions (Signed)

## 2024-05-28 NOTE — Progress Notes (Signed)
  Subjective:  Patient ID: Carol Acosta, female    DOB: 12/10/1960,   MRN: [id number]  Chief Complaint  Patient presents with   Tendonitis    It's doing okay.  She prescribed me Meloxicam .  I can't take that stuff, I couldn't function.   Bunions    I'd like to discuss the bunion on my left foot.  Are there some gadgets I can use?    63 y.o. female presents for follow-up of right achilles tendonitis. Relates doing better. She has been stretching. She did try the meloxicam  but caused her extreme fatigue.  . Denies any other pedal complaints. Denies n/v/f/c.   Past Medical History:  Diagnosis Date   Allergic rhinitis    Asthma    Hiatal hernia 06/2020   History of COVID-19 06/2019   Hyperlipidemia    Hypertension    Osteopenia    Pre-diabetes     Objective:  Physical Exam: Vascular: DP/PT pulses 2/4 bilateral. CFT <3 seconds. Normal hair growth on digits. No edema.  Skin. No lacerations or abrasions bilateral feet.  Musculoskeletal: MMT 5/5 bilateral lower extremities in DF, PF, Inversion and Eversion. Deceased ROM in DF of ankle joint. Minimally tender to insertion of achilles tendon with palpable spurring noted. Some pain proximally along the tendon . No pain currently to medial calcaneal tubercle or around PT tendon. 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   Neurological: Sensation intact to light touch.   Assessment:   1. Tendonitis, Achilles, right       Plan:  Patient was evaluated and treated and all questions answered. X-rays reviewed and discussed with patient. No acute fractures or dislocations noted. Mild spurring noted at inferior calcaneus and posterior calcaneus with haglunds deformity noted. SABRA Pes cavus noted.  -Discussed Achilles insertional tendonitis and treatment options with patient.  Continue stretching and anti-inflammatories as needed.  Discussed briefly bunion etiology and prevention.   -Discussed if no improvement will consider MRI/PT/EPAT/PRP injections.  -Patient to return to office as needed   Asberry Failing, DPM

## 2024-05-29 ENCOUNTER — Ambulatory Visit (INDEPENDENT_AMBULATORY_CARE_PROVIDER_SITE_OTHER)

## 2024-05-29 DIAGNOSIS — J309 Allergic rhinitis, unspecified: Secondary | ICD-10-CM

## 2024-06-04 ENCOUNTER — Encounter: Payer: Self-pay | Admitting: Allergy

## 2024-06-04 ENCOUNTER — Ambulatory Visit: Admitting: Allergy

## 2024-06-04 VITALS — BP 128/76 | HR 81 | Temp 98.5°F | Resp 18 | Wt 151.4 lb

## 2024-06-04 DIAGNOSIS — K219 Gastro-esophageal reflux disease without esophagitis: Secondary | ICD-10-CM

## 2024-06-04 DIAGNOSIS — J301 Allergic rhinitis due to pollen: Secondary | ICD-10-CM

## 2024-06-04 DIAGNOSIS — J3089 Other allergic rhinitis: Secondary | ICD-10-CM | POA: Diagnosis not present

## 2024-06-04 DIAGNOSIS — B999 Unspecified infectious disease: Secondary | ICD-10-CM

## 2024-06-04 DIAGNOSIS — J988 Other specified respiratory disorders: Secondary | ICD-10-CM

## 2024-06-04 DIAGNOSIS — J4551 Severe persistent asthma with (acute) exacerbation: Secondary | ICD-10-CM | POA: Diagnosis not present

## 2024-06-04 DIAGNOSIS — H1013 Acute atopic conjunctivitis, bilateral: Secondary | ICD-10-CM

## 2024-06-04 DIAGNOSIS — J3081 Allergic rhinitis due to animal (cat) (dog) hair and dander: Secondary | ICD-10-CM | POA: Diagnosis not present

## 2024-06-04 MED ORDER — AMOXICILLIN-POT CLAVULANATE 875-125 MG PO TABS
1.0000 | ORAL_TABLET | Freq: Two times a day (BID) | ORAL | 0 refills | Status: DC
Start: 2024-06-04 — End: 2024-07-22

## 2024-06-04 MED ORDER — AIRSUPRA 90-80 MCG/ACT IN AERO: 2.0000 | INHALATION_SPRAY | RESPIRATORY_TRACT | 2 refills | Status: AC | PRN

## 2024-06-04 MED ORDER — METHYLPREDNISOLONE 4 MG PO TBPK: ORAL_TABLET | ORAL | 0 refills | Status: DC

## 2024-06-04 MED ORDER — AZITHROMYCIN 250 MG PO TABS
ORAL_TABLET | ORAL | 0 refills | Status: DC
Start: 2024-06-04 — End: 2024-07-22
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
## 2024-06-04 NOTE — Patient Instructions (Addendum)
 Infection/asthma flare Take medrol  pak.  Start Augmentin  + azithromycin  Take with probiotics.  See below for symptomatic management  Asthma While you have a flare: Start trelegy 200mcg 1 puff once a day and rinse mouth after each use. Sample given.  Hold Breo.   Daily controller medication(s): continue Breo 200mcg 1 puff once a day and rinse mouth after each use.  Continue Dupixent  injections every 2 weeks at home.   Rescue inhaler: May use albuterol  rescue inhaler 2 puffs or nebulizer every 4 to 6 hours as needed for shortness of breath, chest tightness, coughing, and wheezing. May use albuterol  rescue inhaler 2 puffs 5 to 15 minutes prior to strenuous physical activities. OR May use Airsupra  rescue inhaler 2 puffs every 4 to 6 hours as needed for shortness of breath, chest tightness, coughing, and wheezing. Do not use more than 12 puffs in 24 hours. May use Airsupra  rescue inhaler 2 puffs 5 to 15 minutes prior to strenuous physical activities. Rinse mouth after each use.  Coupon given.  Monitor frequency of use - if you need to use it more than twice per week on a consistent basis let us  know.  Breathing control goals:  Full participation in all desired activities (may need albuterol  before activity) Albuterol  use two times or less a week on average (not counting use with activity) Cough interfering with sleep two times or less a month Oral steroids no more than once a year No hospitalizations   Environmental allergies 2025 skin testing positive to trees, mold, dust mites, cat and dog. Continue environmental control measures as below. Clarinex  once a day.  Continue Singulair  (montelukast ) 10mg  daily at night. Start Ryaltris  (olopatadine + mometasone nasal spray combination) 1-2 sprays per nostril twice a day.  This replaces your other nasal sprays. If this works well for you, then have pharmacy ship the medication to your home - prescription already sent in.  Nasal saline spray  (i.e., Simply Saline) or nasal saline lavage (i.e., NeilMed) is recommended as needed and prior to medicated nasal sprays. Consider referral to ENT to look at sinus anatomy in the future.  Allergy  injections - HOLD until you are feeling better.  May get it next week if doing well.   Reflux Continue handout for lifestyle and dietary modifications. Increase omeprazole  to 40mg  once a day - nothing to eat or drink for 20-30 minutes afterwards.  Infections Keep track of infections and antibiotics use. We may get bloodwork at next visit.   Return in about 2 months (around 08/04/2024). Or sooner if needed.   Drink plenty of fluids. Water, juice, clear broth or warm lemon water are good choices. Avoid caffeine and alcohol, which can dehydrate you. Eat chicken soup. Chicken soup and other warm fluids can be soothing and loosen congestion. Rest. Adjust your room's temperature and humidity. Keep your room warm but not overheated. If the air is dry, a cool-mist humidifier or vaporizer can moisten the air and help ease congestion and coughing. Keep the humidifier clean to prevent the growth of bacteria and molds. Soothe your throat. Perform a saltwater gargle. Dissolve one-quarter to a half teaspoon of salt in a 4- to 8-ounce glass of warm water. This can relieve a sore or scratchy throat temporarily. Use saline nasal drops. To help relieve nasal congestion, try saline nasal drops. You can buy these drops over the counter, and they can help relieve symptoms ? even in children. Take over-the-counter cold and cough medications. For adults and children older than  5, over-the-counter decongestants, antihistamines and pain relievers might offer some symptom relief. However, they won't prevent a cold or shorten its duration.

## 2024-06-05 ENCOUNTER — Other Ambulatory Visit: Payer: Self-pay | Admitting: Medical Genetics

## 2024-06-11 ENCOUNTER — Ambulatory Visit
Admission: RE | Admit: 2024-06-11 | Discharge: 2024-06-11 | Disposition: A | Discharge status: Home / Self Care | Source: Ambulatory Visit | Attending: Internal Medicine | Admitting: Internal Medicine

## 2024-06-11 DIAGNOSIS — Z0000 Encounter for general adult medical examination without abnormal findings: Secondary | ICD-10-CM

## 2024-06-14 ENCOUNTER — Ambulatory Visit

## 2024-06-14 DIAGNOSIS — J309 Allergic rhinitis, unspecified: Secondary | ICD-10-CM

## 2024-06-17 ENCOUNTER — Ambulatory Visit: Admitting: Allergy

## 2024-06-18 ENCOUNTER — Ambulatory Visit (INDEPENDENT_AMBULATORY_CARE_PROVIDER_SITE_OTHER)

## 2024-06-18 ENCOUNTER — Other Ambulatory Visit

## 2024-06-18 DIAGNOSIS — Z006 Encounter for examination for normal comparison and control in clinical research program: Secondary | ICD-10-CM

## 2024-06-18 DIAGNOSIS — J309 Allergic rhinitis, unspecified: Secondary | ICD-10-CM | POA: Diagnosis not present

## 2024-06-25 ENCOUNTER — Ambulatory Visit (INDEPENDENT_AMBULATORY_CARE_PROVIDER_SITE_OTHER)

## 2024-06-25 DIAGNOSIS — J309 Allergic rhinitis, unspecified: Secondary | ICD-10-CM | POA: Diagnosis not present

## 2024-06-28 LAB — GENECONNECT MOLECULAR SCREEN: Genetic Analysis Overall Interpretation: NEGATIVE

## 2024-07-03 ENCOUNTER — Ambulatory Visit (INDEPENDENT_AMBULATORY_CARE_PROVIDER_SITE_OTHER)

## 2024-07-03 DIAGNOSIS — J309 Allergic rhinitis, unspecified: Secondary | ICD-10-CM

## 2024-07-10 DIAGNOSIS — E042 Nontoxic multinodular goiter: Secondary | ICD-10-CM

## 2024-07-10 HISTORY — DX: Nontoxic multinodular goiter: E04.2

## 2024-07-11 ENCOUNTER — Ambulatory Visit (INDEPENDENT_AMBULATORY_CARE_PROVIDER_SITE_OTHER)

## 2024-07-11 DIAGNOSIS — J309 Allergic rhinitis, unspecified: Secondary | ICD-10-CM

## 2024-07-19 NOTE — Progress Notes (Signed)
 63 y.o. G2P2 Married Caucasian female here for annual exam. Thinks she may have a yeast infection.  Felt like her vaginal area was on fire.  Treated with Monistat 1 and then 3.   Now she fills like she has yeast in her mouth.    On Prempro  and uses Vagifem  and Estrace  cream for the urethra. Menopause symptoms are under control.     Seeing a new allergist.   Issues with mucus production.   Treated with abx Azithromycin  and Augmentin .   Sees her PCP office for control of her cholesterol and blood sugar.     Had her Covid and flu vaccine last week.   Did genetic testing through [facility]:  negative.   2 children graduated with masters degrees in May.   PCP: Clarice Nottingham, MD   No LMP recorded. Patient has had an ablation.           Sexually active: No.  The current method of family planning is post menopausal status.    Menopausal hormone therapy:  Estrace , Vagifem , prempro   Exercising: Yes.    Walking  Smoker:  no  OB History  Gravida Para Term Preterm AB Living  2 2    3   SAB IAB Ectopic Multiple Live Births     1     # Outcome Date GA Lbr Len/2nd Weight Sex Type Anes PTL Lv  2 Para     M VBAC, Vacuum     1A Para     F CS-LVertical     1B Para     F         HEALTH MAINTENANCE: Last 2 paps:  06/25/20 neg HR HPV neg History of abnormal Pap or positive HPV:  no Mammogram:   06/11/24 Breast Density Cat B, BIRADS Cat 1 neg  Colonoscopy:  2015 - did in June, 2025 - polyps - due in 2030. Bone Density:  09/04/19  Result  osteopenia of spine.  PCP is following and plans for this in January at [facility].     Immunization History  Administered Date(s) Administered   INFLUENZA, HIGH DOSE SEASONAL PF 10/12/2018, 08/30/2019, 11/10/2020   Influenza Inj Mdck Quad Pf 07/14/2023   Influenza Inj Mdck Quad With Preservative 07/10/2017   Influenza,inj,Quad PF,6+ Mos 07/11/2019   Influenza-Unspecified 07/10/2016   PFIZER(Purple Top)SARS-COV-2 Vaccination 12/24/2019,  01/15/2020, 08/14/2020, 07/06/2021      reports that she has never smoked. She has never been exposed to tobacco smoke. She has never used smokeless tobacco. She reports that she does not currently use alcohol. She reports that she does not use drugs.  Past Medical History:  Diagnosis Date   Allergic rhinitis    Asthma    Hiatal hernia 06/2020   History of COVID-19 06/2019   Hyperlipidemia    Hypertension    Osteopenia    Pre-diabetes     Past Surgical History:  Procedure Laterality Date   CESAREAN SECTION  2000   CHOLECYSTECTOMY N/A 06/23/2023   Procedure: LAPAROSCOPIC CHOLECYSTECTOMY;  Surgeon: Polly Cordella LABOR, MD;  Location: [facility];  Service: General;  Laterality: N/A;   ENDOMETRIAL ABLATION     NASAL SINUS SURGERY  1989    Current Outpatient Medications  Medication Sig Dispense Refill   albuterol  (PROVENTIL ) (2.5 MG/3ML) 0.083% nebulizer solution Take 3 mLs (2.5 mg total) by nebulization every 6 (six) hours as needed for wheezing or shortness of breath. 75 mL 11   albuterol  (VENTOLIN  HFA) 108 (90 Base) MCG/ACT  inhaler Inhale 1 puff into the lungs as needed. 18 g 6   Albuterol -Budesonide  (AIRSUPRA ) 90-80 MCG/ACT AERO Inhale 2 puffs into the lungs every 4 (four) hours as needed (coughing, wheezing, chest tightness). Do not exceed 12 puffs in 24 hours. 10.7 g 2   azelastine  (OPTIVAR ) 0.05 % ophthalmic solution Place 1 drop into both eyes daily as needed (allergies).     Cholecalciferol (VITAMIN D) 125 MCG (5000 UT) CAPS Take 5,000 Units by mouth 4 (four) times a week.     clotrimazole-betamethasone (LOTRISONE) cream Apply 1 application  topically daily as needed (on cesarean scar).     desloratadine  (CLARINEX ) 5 MG tablet Take 1 tablet (5 mg total) by mouth daily. 90 tablet 3   DUPIXENT  300 MG/2ML SOAJ INJECT 1 PEN UNDER THE SKIN EVERY 14 DAYS. 12 mL 3   EPINEPHrine  0.3 mg/0.3 mL IJ SOAJ injection Inject 0.3 mg into the muscle as needed for anaphylaxis. 2 each 1   estradiol   (ESTRACE  VAGINAL) 0.1 MG/GM vaginal cream APPLY PEA-SIZE AMOUNT TO URETHRA TWICE A WEEK AT BEDTIME 42.5 g 1   Estradiol  (VAGIFEM ) 10 MCG TABS vaginal tablet Place 1 tablet (10 mcg total) vaginally 2 (two) times a week. 24 tablet 3   estrogen, conjugated,-medroxyprogesterone (PREMPRO ) 0.3-1.5 MG tablet Take 1 tablet by mouth daily. 90 tablet 3   fluticasone  furoate-vilanterol (BREO ELLIPTA ) 200-25 MCG/ACT AEPB Inhale 1 puff into the lungs daily. 90 each 3   montelukast  (SINGULAIR ) 10 MG tablet Take 1 tablet (10 mg total) by mouth at bedtime. 30 tablet 11   olmesartan-hydrochlorothiazide (BENICAR HCT) 40-12.5 MG tablet Take 1 tablet by mouth daily.     Olopatadine-Mometasone (RYALTRIS ) 665-25 MCG/ACT SUSP Place 1-2 sprays into the nose in the morning and at bedtime. 29 g 5   omeprazole  (PRILOSEC) 40 MG capsule Take 1 capsule (40 mg total) by mouth daily. 30 capsule 1   Polyethyl Glyc-Propyl Glyc PF (SYSTANE ULTRA PF) 0.4-0.3 % SOLN Place 1 drop into both eyes as needed (dry eyes).     Probiotic Product (PHILLIPS COLON HEALTH) CAPS Take 1 capsule by mouth every other day.     rosuvastatin  (CRESTOR ) 10 MG tablet Take 5 mg by mouth every evening.     Spacer/Aero-Holding Chambers (AEROCHAMBER MV) inhaler Use as instructed 1 each 0   No current facility-administered medications for this visit.    ALLERGIES: Advair  diskus [fluticasone -salmeterol]  Family History  Problem Relation Age of Onset   Asthma Mother    Allergies Mother    Hypertension Mother    Thyroid disease Mother    COPD Maternal Grandmother    Hypertension Maternal Grandmother    Allergic rhinitis Brother    Dementia Brother    Allergic rhinitis Brother    Breast cancer Neg Hx     Review of Systems  All other systems reviewed and are negative.   PHYSICAL EXAM:  BP 124/82 (BP Location: Left Arm, Patient Position: Sitting)   Pulse 85   Ht 5' 6.25 (1.683 m)   Wt 147 lb (66.7 kg)   SpO2 99%   BMI 23.55 kg/m     General  appearance: alert, cooperative and appears stated age Head: normocephalic, without obvious abnormality, atraumatic Neck:  left thyroid enlargement.    Lungs: clear to auscultation bilaterally Breasts: normal appearance, no masses or tenderness, No nipple retraction or dimpling, No nipple discharge or bleeding, No axillary adenopathy Heart: regular rate and rhythm Abdomen: soft, non-tender; no masses, no organomegaly Extremities: extremities normal,  atraumatic, no cyanosis or edema Skin: skin color, texture, turgor normal. No rashes or lesions Lymph nodes: cervical, supraclavicular, and axillary nodes normal. Neurologic: grossly normal  Pelvic: External genitalia:  no lesions              No abnormal inguinal nodes palpated.              Urethra:  normal appearing urethra with no masses, tenderness or lesions              Bartholins and Skenes: normal                 Vagina: normal appearing vagina with normal color and discharge, no lesions              Cervix: no lesions              Pap taken: no Bimanual Exam:  Uterus:  normal size, contour, position, consistency, mobility, non-tender              Adnexa: no mass, fullness, tenderness              Rectal exam: yes.  Confirms.              Anus:  normal sphincter tone, no lesions  Chaperone was present for exam:  Kari HERO, CMA  ASSESSMENT: Well woman visit with gynecologic exam. Status post endometrial ablation.  HRT.  Osteopenia.  PCP following.  HTN, elevated A1C, elevated cholesterol. Asthma. Left thyromegaly.  Vaginitis.  PHQ-2-9: 0  PLAN: Mammogram screening discussed. Self breast awareness reviewed. Pap and HRV collected:  no.  Due in 2026.  Guidelines for Calcium , Vitamin D, regular exercise program including cardiovascular and weight bearing exercise. Discused WHI and use of HRT which can increase risk of PE, DVT, MI, stroke and breast cancer.  Benefits of treating vasomotor symptoms and reducing risk of  osteoporosis also reviewed. Medication refills:  Prempro , vaginal estrace , Vagifem .  Nuswab vaginitis testing.  TSH, free T3, free T4.  Thyroid ultrasound.  Follow up:  yearly and prn.

## 2024-07-22 ENCOUNTER — Other Ambulatory Visit (HOSPITAL_COMMUNITY)
Admission: RE | Admit: 2024-07-22 | Discharge: 2024-07-22 | Disposition: A | Discharge status: Home / Self Care | Source: Ambulatory Visit | Attending: Obstetrics and Gynecology | Admitting: Obstetrics and Gynecology

## 2024-07-22 ENCOUNTER — Ambulatory Visit (INDEPENDENT_AMBULATORY_CARE_PROVIDER_SITE_OTHER): Admitting: Obstetrics and Gynecology

## 2024-07-22 ENCOUNTER — Ambulatory Visit (INDEPENDENT_AMBULATORY_CARE_PROVIDER_SITE_OTHER)

## 2024-07-22 ENCOUNTER — Telehealth: Payer: Self-pay | Admitting: Obstetrics and Gynecology

## 2024-07-22 ENCOUNTER — Encounter: Payer: Self-pay | Admitting: Obstetrics and Gynecology

## 2024-07-22 VITALS — BP 124/82 | HR 85 | Ht 66.25 in | Wt 147.0 lb

## 2024-07-22 DIAGNOSIS — Z1331 Encounter for screening for depression: Secondary | ICD-10-CM

## 2024-07-22 DIAGNOSIS — E049 Nontoxic goiter, unspecified: Secondary | ICD-10-CM | POA: Diagnosis not present

## 2024-07-22 DIAGNOSIS — J309 Allergic rhinitis, unspecified: Secondary | ICD-10-CM | POA: Diagnosis not present

## 2024-07-22 DIAGNOSIS — N760 Acute vaginitis: Secondary | ICD-10-CM | POA: Diagnosis present

## 2024-07-22 DIAGNOSIS — Z01419 Encounter for gynecological examination (general) (routine) without abnormal findings: Secondary | ICD-10-CM

## 2024-07-22 MED ORDER — PREMPRO 0.3-1.5 MG PO TABS
1.0000 | ORAL_TABLET | Freq: Every day | ORAL | 3 refills | Status: AC
Start: 2024-07-22 — End: ?

## 2024-07-22 MED ORDER — ESTRADIOL 0.1 MG/GM VA CREA: TOPICAL_CREAM | VAGINAL | 1 refills | Status: AC

## 2024-07-22 MED ORDER — ESTRADIOL 10 MCG VA TABS: 1.0000 | ORAL_TABLET | VAGINAL | 3 refills | Status: AC

## 2024-07-22 NOTE — Patient Instructions (Signed)

## 2024-07-22 NOTE — Telephone Encounter (Signed)
 Please assist in scheduling thyroid ultrasound for left thyroid enlargement.   I have placed the order.

## 2024-07-22 NOTE — Telephone Encounter (Signed)
 Order updated to US  thyroid  Spoke with patient, will be out of town 10/20 -10/28, anytime before or after is ok to schedule.   Spoke with [place] at [facility], scheduled for Thyroid US  on 10/14 at 1300 at [facility] [address] location.   Patient notified. Patient is agreeable to date and time.   Routing to provider for final review. Patient is agreeable to disposition. Will close encounter.

## 2024-07-23 ENCOUNTER — Inpatient Hospital Stay
Admission: RE | Admit: 2024-07-23 | Discharge: 2024-07-23 | Attending: Obstetrics and Gynecology | Admitting: Obstetrics and Gynecology

## 2024-07-23 DIAGNOSIS — E049 Nontoxic goiter, unspecified: Secondary | ICD-10-CM

## 2024-07-23 LAB — CERVICOVAGINAL ANCILLARY ONLY
Bacterial Vaginitis (gardnerella): NEGATIVE
Candida Glabrata: NEGATIVE
Candida Vaginitis: NEGATIVE
Comment: NEGATIVE
Comment: NEGATIVE
Comment: NEGATIVE
Comment: NEGATIVE
Trichomonas: NEGATIVE

## 2024-07-23 LAB — TSH: TSH: 1.88 m[IU]/L (ref 0.40–4.50)

## 2024-07-23 LAB — T3, FREE: T3, Free: 3.4 pg/mL (ref 2.3–4.2)

## 2024-07-23 LAB — T4, FREE: Free T4: 1 ng/dL (ref 0.8–1.8)

## 2024-07-24 ENCOUNTER — Ambulatory Visit: Payer: Self-pay | Admitting: Obstetrics and Gynecology

## 2024-07-25 ENCOUNTER — Encounter: Payer: Self-pay | Admitting: Obstetrics and Gynecology

## 2024-07-25 ENCOUNTER — Ambulatory Visit: Payer: Self-pay | Admitting: Obstetrics and Gynecology

## 2024-07-26 ENCOUNTER — Ambulatory Visit (INDEPENDENT_AMBULATORY_CARE_PROVIDER_SITE_OTHER)

## 2024-07-26 DIAGNOSIS — J309 Allergic rhinitis, unspecified: Secondary | ICD-10-CM

## 2024-08-05 ENCOUNTER — Ambulatory Visit: Admitting: Allergy

## 2024-08-06 ENCOUNTER — Ambulatory Visit (INDEPENDENT_AMBULATORY_CARE_PROVIDER_SITE_OTHER)

## 2024-08-06 DIAGNOSIS — J309 Allergic rhinitis, unspecified: Secondary | ICD-10-CM | POA: Diagnosis not present

## 2024-08-08 ENCOUNTER — Telehealth: Payer: Self-pay

## 2024-08-08 ENCOUNTER — Other Ambulatory Visit (HOSPITAL_COMMUNITY): Payer: Self-pay

## 2024-08-08 NOTE — Telephone Encounter (Signed)
*  Pulm  Pharmacy Patient Advocate Encounter   Received notification from Fax that prior authorization for Breo Ellipta  is required/requested.   Insurance verification completed.   The patient is insured through [facility].   Per test claim: Refill too soon. PA is not needed at this time. Medication was filled 08/06/2024. Next eligible fill date is 08/29/2024.

## 2024-08-11 NOTE — Progress Notes (Unsigned)
 Follow Up Note  RE: Carol Acosta MRN: [id number] DOB: May 05, 1961 Date of Office Visit: 08/12/2024  Referring provider: Clarice Nottingham, MD Primary care provider: Clarice Nottingham, MD  Chief Complaint: No chief complaint on file.  History of Present Illness: I had the pleasure of seeing Carol Acosta for a follow up visit at the [facility] on 08/12/2024. She is a 63 y.o. female, who is being followed for asthma on Dupixent , allergic rhinoconjunctivitis on AIT, recurrent infections, GERD. Her previous allergy  office visit was on 06/04/2024 with Dr. Luke. Today is a regular follow up visit.  Discussed the use of AI scribe software for clinical note transcription with the patient, who gave verbal consent to proceed.  History of Present Illness            ***  Assessment and Plan: Carol Acosta is a 63 y.o. female with: Severe persistent asthma with acute exacerbation Respiratory infection Past history - Increased exacerbations after the higher doses of allergy  immunotherapy. Dupixent  may be losing efficacy. Breo preferred due to fewer side effects. Frequent albuterol  and prednisone  use. Possible reflux contribution. 2025 spirometry was normal. Interim history - symptoms flared the last 2 weeks with discolored sputum. Today's spirometry was normal with 120cc and 6% improvement in FEV1 post bronchodilator treatment. Clinically feeling improved.  Take medrol  pak.  Start Augmentin  + azithromycin  Take with probiotics.  See below for symptomatic management While you have a flare: Start trelegy 200mcg 1 puff once a day and rinse mouth after each use. Samples given.  Hold Breo.  Daily controller medication(s): continue Breo 200mcg 1 puff once a day and rinse mouth after each use.  Continue Dupixent  injections every 2 weeks at home.  Rescue inhaler: May use albuterol  rescue inhaler 2 puffs or nebulizer every 4 to 6 hours as needed for shortness of breath, chest tightness,  coughing, and wheezing. May use albuterol  rescue inhaler 2 puffs 5 to 15 minutes prior to strenuous physical activities. OR May use Airsupra  rescue inhaler 2 puffs every 4 to 6 hours as needed for shortness of breath, chest tightness, coughing, and wheezing. Do not use more than 12 puffs in 24 hours. May use Airsupra  rescue inhaler 2 puffs 5 to 15 minutes prior to strenuous physical activities. Rinse mouth after each use.  Coupon given.  Monitor frequency of use - if you need to use it more than twice per week on a consistent basis let us  know.    Seasonal allergic rhinitis due to pollen Allergic rhinitis due to animal dander Allergic rhinitis due to dust mite Allergic rhinitis due to mold Allergic conjunctivitis of both eyes Past history - managed with allergy  shots, higher doses increase respiratory infections. Symptoms include itchy sensations and eyes. Clarinex  and azelastine  used. Ryaltris  effective but unavailable. Last skin testing in 2021 showed multiple positives. Last allergy  injection in April 2025 and wants to continue at a lower dose as they are effective in controlling her symptoms. 2025 skin testing positive to trees, mold, dust mites, cat and dog. Interim history - started AIT on 04/05/2024 (T-DM & M-C-D) with no issues.  Continue environmental control measures as below. Clarinex  once a day.  Continue Singulair  (montelukast ) 10mg  daily at night. Start Ryaltris  (olopatadine + mometasone nasal spray combination) 1-2 sprays per nostril twice a day.  This replaces your other nasal sprays. If this works well for you, then have pharmacy ship the medication to your home - prescription already sent in.  Nasal saline spray (  i.e., Simply Saline) or nasal saline lavage (i.e., NeilMed) is recommended as needed and prior to medicated nasal sprays. Consider referral to ENT to look at sinus anatomy in the future.  Allergy  injections - HOLD until you are feeling better.  May get it next week if  doing well.    Recurrent infections Past history - Frequent sinus infections.  Keep track of infections and antibiotics use. We may get bloodwork at next visit.    GERD Continue handout for lifestyle and dietary modifications. Increase omeprazole  to 40mg  once a day - nothing to eat or drink for 20-30 minutes afterwards. Assessment and Plan              No follow-ups on file.  No orders of the defined types were placed in this encounter.  Lab Orders  No laboratory test(s) ordered today    Diagnostics: Spirometry:  Tracings reviewed. Her effort: {Blank single:19197::Good reproducible efforts.,It was hard to get consistent efforts and there is a question as to whether this reflects a maximal maneuver.,Poor effort, data can not be interpreted.} FVC: ***L FEV1: ***L, ***% predicted FEV1/FVC ratio: ***% Interpretation: {Blank single:19197::Spirometry consistent with mild obstructive disease,Spirometry consistent with moderate obstructive disease,Spirometry consistent with severe obstructive disease,Spirometry consistent with possible restrictive disease,Spirometry consistent with mixed obstructive and restrictive disease,Spirometry uninterpretable due to technique,Spirometry consistent with normal pattern,No overt abnormalities noted given today's efforts}.  Please see scanned spirometry results for details.  Skin Testing: {Blank single:19197::Select foods,Environmental allergy  panel,Environmental allergy  panel and select foods,Food allergy  panel,None,Deferred due to recent antihistamines use}. *** Results discussed with patient/family.   Medication List:  Current Outpatient Medications  Medication Sig Dispense Refill  . albuterol  (PROVENTIL ) (2.5 MG/3ML) 0.083% nebulizer solution Take 3 mLs (2.5 mg total) by nebulization every 6 (six) hours as needed for wheezing or shortness of breath. 75 mL 11  . albuterol  (VENTOLIN  HFA) 108 (90 Base) MCG/ACT  inhaler Inhale 1 puff into the lungs as needed. 18 g 6  . Albuterol -Budesonide  (AIRSUPRA ) 90-80 MCG/ACT AERO Inhale 2 puffs into the lungs every 4 (four) hours as needed (coughing, wheezing, chest tightness). Do not exceed 12 puffs in 24 hours. 10.7 g 2  . azelastine  (OPTIVAR ) 0.05 % ophthalmic solution Place 1 drop into both eyes daily as needed (allergies).    . Cholecalciferol (VITAMIN D) 125 MCG (5000 UT) CAPS Take 5,000 Units by mouth 4 (four) times a week.    . clotrimazole-betamethasone (LOTRISONE) cream Apply 1 application  topically daily as needed (on cesarean scar).    . desloratadine  (CLARINEX ) 5 MG tablet Take 1 tablet (5 mg total) by mouth daily. 90 tablet 3  . DUPIXENT  300 MG/2ML SOAJ INJECT 1 PEN UNDER THE SKIN EVERY 14 DAYS. 12 mL 3  . EPINEPHrine  0.3 mg/0.3 mL IJ SOAJ injection Inject 0.3 mg into the muscle as needed for anaphylaxis. 2 each 1  . estradiol  (ESTRACE ) 0.1 MG/GM vaginal cream APPLY PEA-SIZE AMOUNT TO URETHRA TWICE A WEEK AT BEDTIME 42.5 g 1  . Estradiol  (VAGIFEM ) 10 MCG TABS vaginal tablet Place 1 tablet (10 mcg total) vaginally 2 (two) times a week. 24 tablet 3  . estrogen, conjugated,-medroxyprogesterone (PREMPRO ) 0.3-1.5 MG tablet Take 1 tablet by mouth daily. 90 tablet 3  . fluticasone  furoate-vilanterol (BREO ELLIPTA ) 200-25 MCG/ACT AEPB Inhale 1 puff into the lungs daily. 90 each 3  . montelukast  (SINGULAIR ) 10 MG tablet Take 1 tablet (10 mg total) by mouth at bedtime. 30 tablet 11  . olmesartan-hydrochlorothiazide (BENICAR HCT) 40-12.5 MG tablet  Take 1 tablet by mouth daily.    . Olopatadine-Mometasone (RYALTRIS ) 665-25 MCG/ACT SUSP Place 1-2 sprays into the nose in the morning and at bedtime. 29 g 5  . omeprazole  (PRILOSEC) 40 MG capsule Take 1 capsule (40 mg total) by mouth daily. 30 capsule 1  . Polyethyl Glyc-Propyl Glyc PF (SYSTANE ULTRA PF) 0.4-0.3 % SOLN Place 1 drop into both eyes as needed (dry eyes).    . Probiotic Product (PHILLIPS COLON HEALTH) CAPS  Take 1 capsule by mouth every other day.    . rosuvastatin  (CRESTOR ) 10 MG tablet Take 5 mg by mouth every evening.    SABRA Spacer/Aero-Holding Chambers (AEROCHAMBER MV) inhaler Use as instructed 1 each 0   No current facility-administered medications for this visit.   Allergies: Allergies  Allergen Reactions  . Advair  Diskus [Fluticasone -Salmeterol] Itching and Anxiety    Patient states that she felt like she was going crawl out of her skin.   I reviewed her past medical history, social history, family history, and environmental history and no significant changes have been reported from her previous visit.  Review of Systems  Constitutional:  Negative for appetite change, chills, fever and unexpected weight change.  HENT:  Negative for congestion and rhinorrhea.   Eyes:  Negative for itching.  Respiratory:  Positive for cough and chest tightness. Negative for shortness of breath and wheezing.   Cardiovascular:  Negative for chest pain.  Gastrointestinal:  Negative for abdominal pain.  Genitourinary:  Negative for difficulty urinating.  Skin:  Negative for rash.  Allergic/Immunologic: Positive for environmental allergies.  Neurological:  Negative for headaches.    Objective: There were no vitals taken for this visit. There is no height or weight on file to calculate BMI. Physical Exam Vitals and nursing note reviewed.  Constitutional:      Appearance: Normal appearance. She is well-developed.  HENT:     Head: Normocephalic and atraumatic.     Right Ear: Tympanic membrane and external ear normal.     Left Ear: Tympanic membrane and external ear normal.     Nose: Nose normal.     Mouth/Throat:     Mouth: Mucous membranes are moist.     Pharynx: Oropharynx is clear.  Eyes:     Conjunctiva/sclera: Conjunctivae normal.  Cardiovascular:     Rate and Rhythm: Normal rate and regular rhythm.     Heart sounds: Normal heart sounds. No murmur heard.    No friction rub. No gallop.   Pulmonary:     Effort: Pulmonary effort is normal.     Breath sounds: Wheezing (resolved after bronchodilator treatment) present. No rhonchi or rales.  Musculoskeletal:     Cervical back: Neck supple.  Skin:    General: Skin is warm.     Findings: No rash.  Neurological:     Mental Status: She is alert and oriented to person, place, and time.  Psychiatric:        Behavior: Behavior normal.   Previous notes and tests were reviewed. The plan was reviewed with the patient/family, and all questions/concerned were addressed.  It was my pleasure to see Carol Acosta today and participate in her care. Please feel free to contact me with any questions or concerns.  Sincerely,  Orlan Cramp, DO Allergy  & Immunology  [facility]  [place] office: [phone] [facility] office: [phone]

## 2024-08-12 ENCOUNTER — Ambulatory Visit: Admitting: Allergy

## 2024-08-12 ENCOUNTER — Encounter: Payer: Self-pay | Admitting: Allergy

## 2024-08-12 ENCOUNTER — Other Ambulatory Visit: Payer: Self-pay

## 2024-08-12 VITALS — BP 120/70 | HR 93 | Temp 98.3°F | Resp 18 | Ht 66.0 in | Wt 147.8 lb

## 2024-08-12 DIAGNOSIS — J4550 Severe persistent asthma, uncomplicated: Secondary | ICD-10-CM | POA: Diagnosis not present

## 2024-08-12 DIAGNOSIS — J3089 Other allergic rhinitis: Secondary | ICD-10-CM

## 2024-08-12 DIAGNOSIS — J3081 Allergic rhinitis due to animal (cat) (dog) hair and dander: Secondary | ICD-10-CM | POA: Diagnosis not present

## 2024-08-12 DIAGNOSIS — K219 Gastro-esophageal reflux disease without esophagitis: Secondary | ICD-10-CM

## 2024-08-12 DIAGNOSIS — H1013 Acute atopic conjunctivitis, bilateral: Secondary | ICD-10-CM

## 2024-08-12 DIAGNOSIS — J301 Allergic rhinitis due to pollen: Secondary | ICD-10-CM | POA: Diagnosis not present

## 2024-08-12 DIAGNOSIS — B999 Unspecified infectious disease: Secondary | ICD-10-CM | POA: Diagnosis not present

## 2024-08-12 MED ORDER — BUDESONIDE 0.5 MG/2ML IN SUSP: 0.5000 mg | Freq: Two times a day (BID) | RESPIRATORY_TRACT | 2 refills | Status: AC

## 2024-08-12 NOTE — Patient Instructions (Addendum)
 Asthma Daily controller medication(s): continue Breo 200mcg 1 puff once a day and rinse mouth after each use.  Continue Dupixent  injections every 2 weeks at home.  Get bloodwork if you qualify for a different injectable.  Tezspire or Nucala or Fasenra or Xolair  During respiratory infections/flares:  Start Pulmicort  0.5mg  nebulizer twice a day for 1-2 weeks.  Pretreat with albuterol  2 puffs or albuterol  nebulizer.  If you need to use your albuterol  nebulizer machine back to back within 15-30 minutes with no relief then please go to the ER/urgent care for further evaluation.   Rescue inhaler: May use albuterol  rescue inhaler 2 puffs or nebulizer every 4 to 6 hours as needed for shortness of breath, chest tightness, coughing, and wheezing. May use albuterol  rescue inhaler 2 puffs 5 to 15 minutes prior to strenuous physical activities. OR May use Airsupra  rescue inhaler 2 puffs every 4 to 6 hours as needed for shortness of breath, chest tightness, coughing, and wheezing. Do not use more than 12 puffs in 24 hours. May use Airsupra  rescue inhaler 2 puffs 5 to 15 minutes prior to strenuous physical activities. Rinse mouth after each use.  Monitor frequency of use - if you need to use it more than twice per week on a consistent basis let us  know.  Breathing control goals:  Full participation in all desired activities (may need albuterol  before activity) Albuterol  use two times or less a week on average (not counting use with activity) Cough interfering with sleep two times or less a month Oral steroids no more than once a year No hospitalizations   Environmental allergies 2025 skin testing positive to trees, mold, dust mites, cat and dog. Continue environmental control measures as below. Clarinex  once a day.  Continue Singulair  (montelukast ) 10mg  daily at night. May take Ryaltris  (olopatadine + mometasone nasal spray combination) 1-2 sprays per nostril twice a day.  This replaces your other  nasal sprays. If this works well for you, then have pharmacy ship the medication to your home - prescription already sent in.  Nasal saline spray (i.e., Simply Saline) or nasal saline lavage (i.e., NeilMed) is recommended as needed and prior to medicated nasal sprays. Consider referral to ENT to look at sinus anatomy in the future.  Continue allergy  injections -hold until you are feeling better.   Reflux Continue handout for lifestyle and dietary modifications. May take omeprazole  once a day as needed- nothing to eat or drink for 20-30 minutes afterwards.  Infections Keep track of infections and antibiotics use. Get bloodwork to look at immune system.  Return in about 3 months (around 11/12/2024). Or sooner if needed.

## 2024-08-14 ENCOUNTER — Telehealth: Payer: Self-pay | Admitting: Allergy

## 2024-08-14 NOTE — Telephone Encounter (Signed)
 Spoke with the patient and informed her of Dr. Luke recommendations. Verbalized understanding.

## 2024-08-14 NOTE — Telephone Encounter (Signed)
 Carol Acosta called and wanted to let you know that her Dupixent  will now be  $700, and that the prescription needs to be re written, but she knows you were discussing changing it up anyway, and does not know if she should have her Pulmonologist re write or not. Please advise. Best contact [phone]

## 2024-08-14 NOTE — Telephone Encounter (Signed)
 Please call patient back. I'm still waiting on her labs to come back but based on the results so far, I'm in favor of switching Dupixent  over to [place].   I can start the paperwork to see how much it would be.

## 2024-08-16 LAB — CBC WITH DIFFERENTIAL/PLATELET
Basophils Absolute: 0.1 x10E3/uL (ref 0.0–0.2)
Basos: 1 %
EOS (ABSOLUTE): 0.1 x10E3/uL (ref 0.0–0.4)
Eos: 1 %
Hematocrit: 47.2 % — ABNORMAL HIGH (ref 34.0–46.6)
Hemoglobin: 15.5 g/dL (ref 11.1–15.9)
Immature Grans (Abs): 0 x10E3/uL (ref 0.0–0.1)
Immature Granulocytes: 0 %
Lymphocytes Absolute: 1.8 x10E3/uL (ref 0.7–3.1)
Lymphs: 21 %
MCH: 29.5 pg (ref 26.6–33.0)
MCHC: 32.8 g/dL (ref 31.5–35.7)
MCV: 90 fL (ref 79–97)
Monocytes Absolute: 0.6 x10E3/uL (ref 0.1–0.9)
Monocytes: 7 %
Neutrophils Absolute: 6.2 x10E3/uL (ref 1.4–7.0)
Neutrophils: 70 %
Platelets: 315 x10E3/uL (ref 150–450)
RBC: 5.26 x10E6/uL (ref 3.77–5.28)
RDW: 12.3 % (ref 11.7–15.4)
WBC: 8.8 x10E3/uL (ref 3.4–10.8)

## 2024-08-16 LAB — STREP PNEUMONIAE 23 SEROTYPES IGG
Pneumo Ab Type 1*: 0.3 ug/mL — AB (ref >1.3)
Pneumo Ab Type 12 (12F)*: 0.2 ug/mL — AB (ref >1.3)
Pneumo Ab Type 14*: 4 ug/mL (ref >1.3)
Pneumo Ab Type 17 (17F)*: 2.3 ug/mL (ref >1.3)
Pneumo Ab Type 19 (19F)*: 9.6 ug/mL (ref >1.3)
Pneumo Ab Type 2*: >58.1 ug/mL (ref >1.3)
Pneumo Ab Type 20*: 6.4 ug/mL (ref >1.3)
Pneumo Ab Type 22 (22F)*: 1.1 ug/mL — AB (ref >1.3)
Pneumo Ab Type 23 (23F)*: 7.8 ug/mL (ref >1.3)
Pneumo Ab Type 26 (6B)*: 1 ug/mL — AB (ref >1.3)
Pneumo Ab Type 3*: 0.3 ug/mL — AB (ref >1.3)
Pneumo Ab Type 34 (10A)*: 0.7 ug/mL — AB (ref >1.3)
Pneumo Ab Type 4*: 0.3 ug/mL — AB (ref >1.3)
Pneumo Ab Type 43 (11A)*: 2.2 ug/mL (ref >1.3)
Pneumo Ab Type 5*: 0.6 ug/mL — AB (ref >1.3)
Pneumo Ab Type 51 (7F)*: 0.5 ug/mL — AB (ref >1.3)
Pneumo Ab Type 54 (15B)*: 1.8 ug/mL (ref >1.3)
Pneumo Ab Type 56 (18C)*: <0.1 ug/mL — AB (ref >1.3)
Pneumo Ab Type 57 (19A)*: 5.7 ug/mL (ref >1.3)
Pneumo Ab Type 68 (9V)*: 0.8 ug/mL — AB (ref >1.3)
Pneumo Ab Type 70 (33F)*: 2.8 ug/mL (ref >1.3)
Pneumo Ab Type 8*: 10.6 ug/mL (ref >1.3)
Pneumo Ab Type 9 (9N)*: 1.9 ug/mL (ref >1.3)

## 2024-08-16 LAB — IGG, IGA, IGM
IgG (Immunoglobin G), Serum: 1249 mg/dL (ref 586–1602)
IgM (Immunoglobulin M), Srm: 110 mg/dL (ref 26–217)
Immunoglobulin A, (IgA) QN, Serum: 306 mg/dL (ref 87–352)

## 2024-08-16 LAB — DIPHTHERIA / TETANUS ANTIBODY PANEL
Diphtheria Ab: 0.22 [IU]/mL (ref <0.10)
Tetanus Ab, IgG: 0.84 [IU]/mL (ref <0.10)

## 2024-08-16 LAB — IGE: IgE (Immunoglobulin E), Serum: 10 [IU]/mL (ref 6–495)

## 2024-08-19 ENCOUNTER — Encounter: Payer: Self-pay | Admitting: Allergy

## 2024-08-19 ENCOUNTER — Ambulatory Visit: Payer: Self-pay | Admitting: Allergy

## 2024-08-19 NOTE — Telephone Encounter (Signed)
 Carol Acosta,  Please start PA for Tezspire every 4 weeks for asthma. Dupixent  is not as effective anymore. Thank you.

## 2024-08-23 ENCOUNTER — Ambulatory Visit (INDEPENDENT_AMBULATORY_CARE_PROVIDER_SITE_OTHER): Admitting: *Deleted

## 2024-08-23 DIAGNOSIS — Z23 Encounter for immunization: Secondary | ICD-10-CM | POA: Diagnosis not present

## 2024-08-23 NOTE — Progress Notes (Signed)
 Patient received Pneumovax23 today.   NDC- [id number] LOT- [id number] EXP- 10-28-2025 Left Deltoid

## 2024-08-26 ENCOUNTER — Telehealth: Payer: Self-pay | Admitting: *Deleted

## 2024-08-26 MED ORDER — TEZSPIRE 210 MG/1.91ML ~~LOC~~ SOAJ: 210.0000 mg | SUBCUTANEOUS | 11 refills | Status: AC

## 2024-08-26 NOTE — Telephone Encounter (Signed)
 Advised patient on approval and submit for Tezspire to [facility]

## 2024-08-26 NOTE — Telephone Encounter (Signed)
 Called patient and advised approval, copay card and submit to [facility] Tezspire to replace Dupixent .

## 2024-08-27 ENCOUNTER — Other Ambulatory Visit: Payer: Self-pay

## 2024-08-27 ENCOUNTER — Encounter: Payer: Self-pay | Admitting: Allergy

## 2024-08-27 ENCOUNTER — Ambulatory Visit: Admitting: Allergy

## 2024-08-27 VITALS — BP 110/60 | HR 91 | Temp 97.5°F | Resp 18 | Wt 145.0 lb

## 2024-08-27 DIAGNOSIS — B999 Unspecified infectious disease: Secondary | ICD-10-CM

## 2024-08-27 DIAGNOSIS — J4551 Severe persistent asthma with (acute) exacerbation: Secondary | ICD-10-CM

## 2024-08-27 DIAGNOSIS — K219 Gastro-esophageal reflux disease without esophagitis: Secondary | ICD-10-CM

## 2024-08-27 DIAGNOSIS — J301 Allergic rhinitis due to pollen: Secondary | ICD-10-CM | POA: Diagnosis not present

## 2024-08-27 DIAGNOSIS — J3089 Other allergic rhinitis: Secondary | ICD-10-CM

## 2024-08-27 DIAGNOSIS — J3081 Allergic rhinitis due to animal (cat) (dog) hair and dander: Secondary | ICD-10-CM

## 2024-08-27 DIAGNOSIS — H1013 Acute atopic conjunctivitis, bilateral: Secondary | ICD-10-CM

## 2024-08-27 DIAGNOSIS — J988 Other specified respiratory disorders: Secondary | ICD-10-CM

## 2024-08-27 MED ORDER — METHYLPREDNISOLONE 4 MG PO TBPK: ORAL_TABLET | ORAL | 0 refills | Status: DC

## 2024-08-27 MED ORDER — AMOXICILLIN-POT CLAVULANATE 875-125 MG PO TABS: 1.0000 | ORAL_TABLET | Freq: Two times a day (BID) | ORAL | 0 refills | Status: AC

## 2024-08-27 NOTE — Progress Notes (Signed)
 Follow Up Note  RE: Carol Acosta MRN: [id number] DOB: 11-Sep-1961 Date of Office Visit: 08/27/2024  Referring provider: Clarice Nottingham, MD Primary care provider: Clarice Nottingham, MD  Chief Complaint: Follow-up (Patient reports the same issue as she was seen on 08/12/24. Pt reports getting her pneumonia vaccine on 08/23/2024 and was fine. Patient then started feeling bad again on Saturday. She states the pulmicort  had been helping.)  History of Present Illness: I had the pleasure of seeing Carol Acosta for a follow up visit at the [facility] on 08/27/2024. She is a 63 y.o. female, who is being followed for asthma, allergic rhino conjunctivitis, recurrent infections and GERD. Her previous allergy  office visit was on 08/12/2024 with Dr. Luke. Today is a new complaint visit of worsening symptoms.  Discussed the use of AI scribe software for clinical note transcription with the patient, who gave verbal consent to proceed.    She was seen on November 3rd for follow up and was prescribed albuterol  and Pulmicort , which she used with a nebulizer. Due to a pharmacy delay, she only received Pulmicort  on Tuesday night, but noticed improvement in her symptoms by Friday, November 7th. She continued the treatment for a week and felt normal with her breathing until November 11th.  On November 13th, she went out with friends and suspects she might have contracted a virus, as the host's wife was sick. She felt fine on November 14th. On November 15th, after cleaning windows outside, she and her husband both developed raspy breathing. On November 16th, while traveling to [place], she began to suspect worsening symptoms and used Pulmicort  and albuterol . By the afternoon, she felt she might have an asthma attack and used her rescue inhaler twice. She struggled with breathing that night.  Since November 15th, she has been using the nebulizer morning and night. On November 17th, she felt unwell and  used Marketing [employer] at lunchtime. She noted coughing up green mucus. No fever or chills but describes 'crawly skin' and feeling 'weird'.  She mentions a history of being hospitalized for respiratory issues as a child and notes that she has been sick five times since March. She is currently using Pulmicort , albuterol , and Air Supra, and has previously used a Medrol  pack and antibiotics in August.      2025 labs: Your blood count, immunoglobulin levels were normal which is great. You also have good protection against diptheria and tetanus.   Your eosinophils were only 100 and IgE was 10. Based on these results, we can try to get Tezspire injections approved for asthma. It's an injection every 4 weeks. Let me know if you want me to start the paperwork for this.    Your pneumococcal titers were protective against 12 out 23 strains. Sometimes people with low titers are more likely to develop respiratory infections caused by the bacteria strep pneumoniae.  Have you received a pneumonia vaccine in the past?   If not, I would like for you to get the pneumovax 23 vaccine (also known as the pneumonia shot) as it can boost the levels and offer protection against this bacteria in the future. Once you get the vaccine, we check the levels 4 weeks afterwards to make sure your immune system responded to the vaccine appropriately. You can get the pneumovax vaccine at your PCP's office or pharmacy. If they don't offer it there, let us  know and in certain cases we have given them in our office.    Make  sure it's the pneumovax 23 vaccine and NOT the prevnar.   Assessment and Plan: Carol Acosta is a 63 y.o. female with:  Respiratory infection Severe persistent asthma with acute exacerbation (HCC) Exacerbation likely due to respiratory infection, with suspicion of bacterial involvement due to green mucus and persistent symptoms. Take medrol  pak.  Start Augmentin  875mg  twice a day for 10 days.  Daily controller  medication(s): continue Breo 200mcg 1 puff once a day and rinse mouth after each use.  Continue Dupixent  injections every 2 weeks at home.  Will switch over to Tezspire injection every 4 weeks.  First dose in the office.  During respiratory infections/flares:  Start Pulmicort  0.5mg  nebulizer twice a day for 1-2 weeks.  Pretreat with albuterol  2 puffs or albuterol  nebulizer.  If you need to use your albuterol  nebulizer machine back to back within 15-30 minutes with no relief then please go to the ER/urgent care for further evaluation.  Rescue inhaler: May use albuterol  rescue inhaler 2 puffs or nebulizer every 4 to 6 hours as needed for shortness of breath, chest tightness, coughing, and wheezing. May use albuterol  rescue inhaler 2 puffs 5 to 15 minutes prior to strenuous physical activities. OR May use Airsupra  rescue inhaler 2 puffs every 4 to 6 hours as needed for shortness of breath, chest tightness, coughing, and wheezing. Do not use more than 12 puffs in 24 hours. May use Airsupra  rescue inhaler 2 puffs 5 to 15 minutes prior to strenuous physical activities. Rinse mouth after each use.  Monitor frequency of use - if you need to use it more than twice per week on a consistent basis let us  know.   Recurrent infections Past history - Frequent sinus infections. 2025 labs normal immunoglobulin levels, poor pneumococcal titers. Interim history - got pneumonia shot. Keep track of infections and antibiotics use. Get bloodwork to look at titers in 4 weeks.   Seasonal allergic rhinitis due to pollen Allergic rhinitis due to animal dander Allergic rhinitis due to dust mite Allergic rhinitis due to mold Allergic conjunctivitis of both eyes Past history - managed with allergy  shots, higher doses increase respiratory infections. Symptoms include itchy sensations and eyes. Clarinex  and azelastine  used. Ryaltris  effective but unavailable. Last skin testing in 2021 showed multiple positives. Last  allergy  injection in April 2025 and wants to continue at a lower dose as they are effective in controlling her symptoms. 2025 skin testing positive to trees, mold, dust mites, cat and dog. 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 Clarinex  once a day.  Continue Singulair  (montelukast ) 10mg  daily at night. May take Ryaltris  (olopatadine + mometasone nasal spray combination) 1-2 sprays per nostril twice a day.  This replaces your other nasal sprays. If this works well for you, then have pharmacy ship the medication to your home - prescription already sent in.  Nasal saline spray (i.e., Simply Saline) or nasal saline lavage (i.e., NeilMed) is recommended as needed and prior to medicated nasal sprays. Consider referral to ENT to look at sinus anatomy in the future.  Continue allergy  injections -hold until you are feeling better.   Gastroesophageal reflux disease, unspecified whether esophagitis present Continue handout for lifestyle and dietary modifications. May take omeprazole  once a day as needed- nothing to eat or drink for 20-30 minutes afterwards.   Return in about 3 months (around 11/27/2024).  Meds ordered this encounter  Medications   amoxicillin -clavulanate (AUGMENTIN ) 875-125 MG tablet    Sig: Take 1 tablet by mouth  2 (two) times daily.    Dispense:  20 tablet    Refill:  0   methylPREDNISolone  (MEDROL  DOSEPAK) 4 MG TBPK tablet    Sig: Take 6 tablets on day 1, 5 tablets on day 2, 4 tabs on day 3, 3 tabs on day 4, 2 tabs on day 5, 1 tab on day 6.    Dispense:  21 tablet    Refill:  0   Lab Orders         Strep pneumoniae 23 Serotypes IgG      Diagnostics: None.   Medication List:  Current Outpatient Medications  Medication Sig Dispense Refill   albuterol  (PROVENTIL ) (2.5 MG/3ML) 0.083% nebulizer solution Take 3 mLs (2.5 mg total) by nebulization every 6 (six) hours as needed for wheezing or shortness of breath. 75 mL 11    albuterol  (VENTOLIN  HFA) 108 (90 Base) MCG/ACT inhaler Inhale 1 puff into the lungs as needed. 18 g 6   Albuterol -Budesonide  (AIRSUPRA ) 90-80 MCG/ACT AERO Inhale 2 puffs into the lungs every 4 (four) hours as needed (coughing, wheezing, chest tightness). Do not exceed 12 puffs in 24 hours. 10.7 g 2   amoxicillin -clavulanate (AUGMENTIN ) 875-125 MG tablet Take 1 tablet by mouth 2 (two) times daily. 20 tablet 0   azelastine  (OPTIVAR ) 0.05 % ophthalmic solution Place 1 drop into both eyes daily as needed (allergies).     budesonide  (PULMICORT ) 0.5 MG/2ML nebulizer solution Take 2 mLs (0.5 mg total) by nebulization in the morning and at bedtime. 120 mL 2   Cholecalciferol (VITAMIN D) 125 MCG (5000 UT) CAPS Take 5,000 Units by mouth 4 (four) times a week.     clotrimazole-betamethasone (LOTRISONE) cream Apply 1 application  topically daily as needed (on cesarean scar).     desloratadine  (CLARINEX ) 5 MG tablet Take 1 tablet (5 mg total) by mouth daily. 90 tablet 3   EPINEPHrine  0.3 mg/0.3 mL IJ SOAJ injection Inject 0.3 mg into the muscle as needed for anaphylaxis. 2 each 1   estradiol  (ESTRACE ) 0.1 MG/GM vaginal cream APPLY PEA-SIZE AMOUNT TO URETHRA TWICE A WEEK AT BEDTIME 42.5 g 1   Estradiol  (VAGIFEM ) 10 MCG TABS vaginal tablet Place 1 tablet (10 mcg total) vaginally 2 (two) times a week. 24 tablet 3   estrogen, conjugated,-medroxyprogesterone (PREMPRO ) 0.3-1.5 MG tablet Take 1 tablet by mouth daily. 90 tablet 3   fluticasone  furoate-vilanterol (BREO ELLIPTA ) 200-25 MCG/ACT AEPB Inhale 1 puff into the lungs daily. 90 each 3   methylPREDNISolone  (MEDROL  DOSEPAK) 4 MG TBPK tablet Take 6 tablets on day 1, 5 tablets on day 2, 4 tabs on day 3, 3 tabs on day 4, 2 tabs on day 5, 1 tab on day 6. 21 tablet 0   montelukast  (SINGULAIR ) 10 MG tablet Take 1 tablet (10 mg total) by mouth at bedtime. 30 tablet 11   olmesartan-hydrochlorothiazide (BENICAR HCT) 40-12.5 MG tablet Take 1 tablet by mouth daily.      Olopatadine-Mometasone (RYALTRIS ) 665-25 MCG/ACT SUSP Place 1-2 sprays into the nose in the morning and at bedtime. 29 g 5   Polyethyl Glyc-Propyl Glyc PF (SYSTANE ULTRA PF) 0.4-0.3 % SOLN Place 1 drop into both eyes as needed (dry eyes).     Probiotic Product (PHILLIPS COLON HEALTH) CAPS Take 1 capsule by mouth every other day.     rosuvastatin  (CRESTOR ) 10 MG tablet Take 5 mg by mouth every evening.     Spacer/Aero-Holding Chambers (AEROCHAMBER MV) inhaler Use as instructed 1 each 0  Tezepelumab-ekko (TEZSPIRE) 210 MG/1. SOAJ Inject 210 mg into the skin every 28 (twenty-eight) days. 1.91 mL 11   omeprazole  (PRILOSEC) 40 MG capsule Take 1 capsule (40 mg total) by mouth daily. (Patient not taking: Reported on 08/12/2024) 30 capsule 1   No current facility-administered medications for this visit.   Allergies: Allergies  Allergen Reactions   Advair  Diskus [Fluticasone -Salmeterol] Itching and Anxiety    Patient states that she felt like she was going crawl out of her skin.   I reviewed her past medical history, social history, family history, and environmental history and no significant changes have been reported from her previous visit.  Review of Systems  Constitutional:  Negative for appetite change, chills, fever and unexpected weight change.  HENT:  Negative for congestion and rhinorrhea.   Eyes:  Negative for itching.  Respiratory:  Positive for cough, chest tightness and shortness of breath. Negative for wheezing.   Cardiovascular:  Negative for chest pain.  Gastrointestinal:  Negative for abdominal pain.  Genitourinary:  Negative for difficulty urinating.  Skin:  Negative for rash.  Allergic/Immunologic: Positive for environmental allergies.  Neurological:  Negative for headaches.    Objective: BP 110/60 (BP Location: Left Arm, Patient Position: Sitting, Cuff Size: Normal)   Pulse 91   Temp (!) 97.5 F (36.4 C) (Temporal)   Resp 18   Wt 145 lb (65.8 kg)   SpO2 96%    BMI 23.40 kg/m  Body mass index is 23.4 kg/m. Physical Exam Vitals and nursing note reviewed.  Constitutional:      Appearance: Normal appearance. She is well-developed.  HENT:     Head: Normocephalic and atraumatic.     Right Ear: Tympanic membrane and external ear normal.     Left Ear: Tympanic membrane and external ear normal.     Nose: Nose normal.     Mouth/Throat:     Mouth: Mucous membranes are moist.     Pharynx: Oropharynx is clear.  Eyes:     Conjunctiva/sclera: Conjunctivae normal.  Cardiovascular:     Rate and Rhythm: Normal rate and regular rhythm.     Heart sounds: Normal heart sounds. No murmur heard.    No friction rub. No gallop.  Pulmonary:     Effort: Pulmonary effort is normal.     Breath sounds: No wheezing, rhonchi or rales.  Musculoskeletal:     Cervical back: Neck supple.  Skin:    General: Skin is warm.     Findings: No rash.  Neurological:     Mental Status: She is alert and oriented to person, place, and time.  Psychiatric:        Behavior: Behavior normal.    Previous notes and tests were reviewed. The plan was reviewed with the patient/family, and all questions/concerned were addressed.  It was my pleasure to see Carol Acosta today and participate in her care. Please feel free to contact me with any questions or concerns.  Sincerely,  Orlan Cramp, DO Allergy  & Immunology  [facility]  [place] office: [phone] [facility] office: [phone]

## 2024-08-27 NOTE — Patient Instructions (Addendum)
 Asthma exacerbation Take medrol  pak.  Start Augmentin  875mg  twice a day for 10 days.   Daily controller medication(s): continue Breo 200mcg 1 puff once a day and rinse mouth after each use.  Continue Dupixent  injections every 2 weeks at home.  Will switch over to Tezspire injection every 4 weeks.  First dose in the office.  During respiratory infections/flares:  Start Pulmicort  0.5mg  nebulizer twice a day for 1-2 weeks.  Pretreat with albuterol  2 puffs or albuterol  nebulizer.  If you need to use your albuterol  nebulizer machine back to back within 15-30 minutes with no relief then please go to the ER/urgent care for further evaluation.   Rescue inhaler: May use albuterol  rescue inhaler 2 puffs or nebulizer every 4 to 6 hours as needed for shortness of breath, chest tightness, coughing, and wheezing. May use albuterol  rescue inhaler 2 puffs 5 to 15 minutes prior to strenuous physical activities. OR May use Airsupra  rescue inhaler 2 puffs every 4 to 6 hours as needed for shortness of breath, chest tightness, coughing, and wheezing. Do not use more than 12 puffs in 24 hours. May use Airsupra  rescue inhaler 2 puffs 5 to 15 minutes prior to strenuous physical activities. Rinse mouth after each use.  Monitor frequency of use - if you need to use it more than twice per week on a consistent basis let us  know.  Breathing control goals:  Full participation in all desired activities (may need albuterol  before activity) Albuterol  use two times or less a week on average (not counting use with activity) Cough interfering with sleep two times or less a month Oral steroids no more than once a year No hospitalizations   Environmental allergies 2025 skin testing positive to trees, mold, dust mites, cat and dog. Continue environmental control measures as below. Clarinex  once a day.  Continue Singulair  (montelukast ) 10mg  daily at night. May take Ryaltris  (olopatadine + mometasone nasal spray combination)  1-2 sprays per nostril twice a day.  This replaces your other nasal sprays. If this works well for you, then have pharmacy ship the medication to your home - prescription already sent in.  Nasal saline spray (i.e., Simply Saline) or nasal saline lavage (i.e., NeilMed) is recommended as needed and prior to medicated nasal sprays. Consider referral to ENT to look at sinus anatomy in the future.  Continue allergy  injections -hold until you are feeling better.   Reflux Continue handout for lifestyle and dietary modifications. May take omeprazole  once a day as needed- nothing to eat or drink for 20-30 minutes afterwards.  Infections Keep track of infections and antibiotics use. Get bloodwork to look at titers in 4 weeks.   Return in about 3 months (around 11/27/2024). Or sooner if needed.

## 2024-09-09 ENCOUNTER — Ambulatory Visit

## 2024-09-09 DIAGNOSIS — J309 Allergic rhinitis, unspecified: Secondary | ICD-10-CM | POA: Diagnosis not present

## 2024-09-13 ENCOUNTER — Ambulatory Visit

## 2024-09-13 DIAGNOSIS — J309 Allergic rhinitis, unspecified: Secondary | ICD-10-CM

## 2024-09-19 ENCOUNTER — Ambulatory Visit

## 2024-09-19 DIAGNOSIS — J4551 Severe persistent asthma with (acute) exacerbation: Secondary | ICD-10-CM

## 2024-09-19 MED ORDER — TEZEPELUMAB-EKKO 210 MG/1.91ML ~~LOC~~ SOSY
210.0000 mg | PREFILLED_SYRINGE | Freq: Once | SUBCUTANEOUS | Status: AC
  Administered 2024-09-19: 210 mg via SUBCUTANEOUS

## 2024-09-19 NOTE — Progress Notes (Signed)
 Immunotherapy   Patient Details  Name: Carol Acosta MRN: [id number] Date of Birth: 12-Nov-1960  09/19/2024  Carol Acosta started Tezspire  Injections for her Asthma. Consent signed and patient instructions given. Provided teaching on injection administration. Patient administered her injection. She will continue Tezspire  injections every 4 weeks at home. Patient waited in office 15 minutes after with no issues.   Carol Acosta 09/19/2024, 4:23 PM

## 2024-09-30 ENCOUNTER — Ambulatory Visit: Payer: Self-pay | Admitting: Allergy

## 2024-09-30 ENCOUNTER — Ambulatory Visit

## 2024-09-30 DIAGNOSIS — J309 Allergic rhinitis, unspecified: Secondary | ICD-10-CM

## 2024-09-30 LAB — STREP PNEUMONIAE 23 SEROTYPES IGG
Pneumo Ab Type 1*: 0.5 ug/mL — ABNORMAL LOW
Pneumo Ab Type 12 (12F)*: 0.5 ug/mL — ABNORMAL LOW
Pneumo Ab Type 14*: 8.2 ug/mL
Pneumo Ab Type 17 (17F)*: 5.5 ug/mL
Pneumo Ab Type 19 (19F)*: 10.4 ug/mL
Pneumo Ab Type 2*: >58.1 ug/mL
Pneumo Ab Type 20*: 19.8 ug/mL
Pneumo Ab Type 22 (22F)*: 8.3 ug/mL
Pneumo Ab Type 23 (23F)*: 3.8 ug/mL
Pneumo Ab Type 26 (6B)*: 1.1 ug/mL — ABNORMAL LOW
Pneumo Ab Type 3*: 0.4 ug/mL — ABNORMAL LOW
Pneumo Ab Type 34 (10A)*: 0.9 ug/mL — ABNORMAL LOW
Pneumo Ab Type 4*: 0.5 ug/mL — ABNORMAL LOW
Pneumo Ab Type 43 (11A)*: 8.6 ug/mL
Pneumo Ab Type 5*: 3.6 ug/mL
Pneumo Ab Type 51 (7F)*: 1 ug/mL — ABNORMAL LOW
Pneumo Ab Type 54 (15B)*: 6.5 ug/mL
Pneumo Ab Type 56 (18C)*: 0.3 ug/mL — ABNORMAL LOW
Pneumo Ab Type 57 (19A)*: 7.4 ug/mL
Pneumo Ab Type 68 (9V)*: 1.8 ug/mL
Pneumo Ab Type 70 (33F)*: 7.3 ug/mL
Pneumo Ab Type 8*: 36.8 ug/mL
Pneumo Ab Type 9 (9N)*: 1.9 ug/mL

## 2024-10-07 ENCOUNTER — Ambulatory Visit

## 2024-10-07 DIAGNOSIS — J309 Allergic rhinitis, unspecified: Secondary | ICD-10-CM | POA: Diagnosis not present

## 2024-10-14 DIAGNOSIS — J309 Allergic rhinitis, unspecified: Secondary | ICD-10-CM

## 2024-10-22 ENCOUNTER — Ambulatory Visit

## 2024-10-22 DIAGNOSIS — J302 Other seasonal allergic rhinitis: Secondary | ICD-10-CM | POA: Diagnosis not present

## 2024-10-29 ENCOUNTER — Encounter: Payer: Self-pay | Admitting: Allergy

## 2024-10-29 ENCOUNTER — Other Ambulatory Visit: Payer: Self-pay

## 2024-10-29 ENCOUNTER — Ambulatory Visit: Admitting: Allergy

## 2024-10-29 VITALS — BP 110/68 | HR 86 | Temp 98.2°F | Resp 16 | Wt 148.1 lb

## 2024-10-29 DIAGNOSIS — J301 Allergic rhinitis due to pollen: Secondary | ICD-10-CM

## 2024-10-29 DIAGNOSIS — J4551 Severe persistent asthma with (acute) exacerbation: Secondary | ICD-10-CM

## 2024-10-29 DIAGNOSIS — J3089 Other allergic rhinitis: Secondary | ICD-10-CM | POA: Diagnosis not present

## 2024-10-29 DIAGNOSIS — J3081 Allergic rhinitis due to animal (cat) (dog) hair and dander: Secondary | ICD-10-CM

## 2024-10-29 DIAGNOSIS — K219 Gastro-esophageal reflux disease without esophagitis: Secondary | ICD-10-CM | POA: Diagnosis not present

## 2024-10-29 DIAGNOSIS — H1013 Acute atopic conjunctivitis, bilateral: Secondary | ICD-10-CM

## 2024-10-29 MED ORDER — METHYLPREDNISOLONE 4 MG PO TBPK: ORAL_TABLET | ORAL | 0 refills | Status: AC

## 2024-10-29 NOTE — Patient Instructions (Addendum)
 Asthma exacerbation Take medrol  pak if not feeling better.  Start Spiriva Respimat 2.5mcg 1 puff once a day for 1 week. Sample given.  Daily controller medication(s): continue Breo 200mcg 1 puff once a day and rinse mouth after each use.  Continue Tezspire  injections every 4 weeks at home.  During respiratory infections/flares:  Start Pulmicort  0.5mg  nebulizer twice a day for 1-2 weeks.  Pretreat with albuterol  2 puffs or albuterol  nebulizer.  If you need to use your albuterol  nebulizer machine back to back within 15-30 minutes with no relief then please go to the ER/urgent care for further evaluation.   Rescue inhaler: May use albuterol  rescue inhaler 2 puffs or nebulizer every 4 to 6 hours as needed for shortness of breath, chest tightness, coughing, and wheezing. May use albuterol  rescue inhaler 2 puffs 5 to 15 minutes prior to strenuous physical activities. OR May use Airsupra  rescue inhaler 2 puffs every 4 to 6 hours as needed for shortness of breath, chest tightness, coughing, and wheezing. Do not use more than 12 puffs in 24 hours. May use Airsupra  rescue inhaler 2 puffs 5 to 15 minutes prior to strenuous physical activities. Rinse mouth after each use.  Monitor frequency of use - if you need to use it more than twice per week on a consistent basis let us  know.  Breathing control goals:  Full participation in all desired activities (may need albuterol  before activity) Albuterol  use two times or less a week on average (not counting use with activity) Cough interfering with sleep two times or less a month Oral steroids no more than once a year No hospitalizations   Environmental allergies 2025 skin testing positive to trees, mold, dust mites, cat and dog. Continue environmental control measures.  Clarinex  once a day or twice a day as needed.  Continue Singulair  (montelukast ) 10mg  daily at night. May take Ryaltris  (olopatadine + mometasone nasal spray combination) 1-2 sprays per  nostril twice a day.  This replaces your other nasal sprays. If this works well for you, then have pharmacy ship the medication to your home - prescription already sent in.  Nasal saline spray (i.e., Simply Saline) or nasal saline lavage (i.e., NeilMed) is recommended as needed and prior to medicated nasal sprays. Consider referral to ENT to look at sinus anatomy in the future.  Continue allergy  injections -hold until you are feeling better.   Reflux Continue handout for lifestyle and dietary modifications. May take omeprazole  once a day as needed- nothing to eat or drink for 20-30 minutes afterwards.  Infections Keep track of infections and antibiotics use.  Return in about 2 months (around 12/27/2024). Or sooner if needed.

## 2024-10-29 NOTE — Progress Notes (Signed)
 "  Follow Up Note  RE: Carol Acosta MRN: [id number] DOB: [birth year] Date of Office Visit: 10/29/2024  Referring provider: Clarice Nottingham, MD Primary care provider: Clarice Nottingham, MD  Chief Complaint: Cough, Sinus Problem, and Follow-up (Same day chest tightness and nasal congestion, sinus issues had shingles and tetanus shots last week)  History of Present Illness: I had the pleasure of seeing Carol Acosta for a follow up visit at the [facility] on 10/29/2024. She is a 64 y.o. female, who is being followed for asthma on Tezspire , recurrent infections, allergic rhino conjunctivitis, GERD. Her previous allergy  office visit was on 08/27/2024 with Dr. Luke. Today is a new complaint visit of asthma flare.  Discussed the use of AI scribe software for clinical note transcription with the patient, who gave verbal consent to proceed.    She has been using Tezspire  monthly at home, and reports improvement in mucus production and breathing. The last dose was on January 8th. She is also using Breo 200mcg, one puff once a day, and has Airsupra  and albuterol  as rescue inhalers. Since starting Tezspire , she has not needed to use albuterol  as much up until last week.  Recently, she experienced nasal congestion and difficulty breathing, initially attributing these symptoms to side effects from a shingles shot received last Friday. She also received a tetanus shot as she was eight years overdue. Following the shingles shot, she felt feverish and unwell for a few days, with symptoms including nasal tingling and congestion. She increased her use of nasal sprays and washes to manage these symptoms.  On Sunday, she used Airsupra  twice due to feeling tightness in her chest. By Monday, she experienced significant shortness of breath after minimal exertion, such as vacuuming one room. A COVID test was negative, and she reported no fever. She has been using desloratadine  twice daily and Singulair   every night. She resumed using Ryaltris  on Monday and has been doing nasal washes.  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 No wheezing but reports chest tightness. She has not used her nebulizer today but used it last night with Pulmicort  due to feeling tired and tightness in her chest.     Assessment and Plan: Carol Acosta is a 64 y.o. female with: Severe persistent asthma with acute exacerbation (HCC) Past history - didn't tolerate Trelegy. Tried Dupixent .  Interim history - Exacerbation likely due to respiratory infection - most likely viral. Doing better with Tezspire .  Today's spirometry was normal - slightly worse than prior one. Take medrol  pak if not feeling better.  Start Spiriva Respimat 2.5mcg 1 puff once a day for 1 week. Sample given. Daily controller medication(s): continue Breo 200mcg 1 puff once a day and rinse mouth after each use.  Continue Tezspire  injections every 4 weeks at home.  During respiratory infections/flares:  Start Pulmicort  0.5mg  nebulizer twice a day for 1-2 weeks.  Pretreat with albuterol  2 puffs or albuterol  nebulizer.  If you need to use your albuterol  nebulizer machine back to back within 15-30 minutes with no relief then please go to the ER/urgent care for further evaluation.  Rescue inhaler: May use albuterol  rescue inhaler 2 puffs or nebulizer every 4 to 6 hours as needed for shortness of breath, chest tightness, coughing, and wheezing. May use albuterol  rescue inhaler 2 puffs 5 to 15 minutes prior to strenuous physical activities. OR May use Airsupra  rescue inhaler 2 puffs every 4 to 6 hours as  needed for shortness of breath, chest tightness, coughing, and wheezing. Do not use more than 12 puffs in 24 hours. May use Airsupra  rescue inhaler 2 puffs 5 to 15 minutes prior to strenuous physical activities. Rinse mouth after each use.  Monitor frequency of use - if you need to use it more than twice  per week on a consistent basis let us  know.    Recurrent infections Past history - Frequent sinus infections. 2025 labs normal immunoglobulin levels, poor pneumococcal titers some improvement post vaccine.  Keep track of infections and antibiotics use.   Seasonal allergic rhinitis due to pollen Allergic rhinitis due to animal dander Allergic rhinitis due to dust mite Allergic rhinitis due to mold Allergic conjunctivitis of both eyes Past history - managed with allergy  shots, higher doses increase respiratory infections. Symptoms include itchy sensations and eyes. Clarinex  and azelastine  used. Ryaltris  effective but unavailable. Last skin testing in 2021 showed multiple positives. Last allergy  injection in April 2025 and wants to continue at a lower dose as they are effective in controlling her symptoms. 2025 skin testing positive to trees, mold, dust mites, cat and dog. 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 Continue environmental control measures.  Clarinex  once a day or twice a day as needed.  Continue Singulair  (montelukast ) 10mg  daily at night. May take Ryaltris  (olopatadine + mometasone nasal spray combination) 1-2 sprays per nostril twice a day.  This replaces your other nasal sprays. If this works well for you, then have pharmacy ship the medication to your home - prescription already sent in.  Nasal saline spray (i.e., Simply Saline) or nasal saline lavage (i.e., NeilMed) is recommended as needed and prior to medicated nasal sprays. Consider referral to ENT to look at sinus anatomy in the future.  Continue allergy  injections -hold until you are feeling better.    Gastroesophageal reflux disease, unspecified whether esophagitis present Continue handout for lifestyle and dietary modifications. May take omeprazole  once a day as needed- nothing to eat or drink for 20-30 minutes afterwards.  Return in about 2 months (around 12/27/2024).  Meds ordered this encounter  Medications    methylPREDNISolone  (MEDROL  DOSEPAK) 4 MG TBPK tablet    Sig: Take 6 tablets on day 1, 5 tablets on day 2, 4 tabs on day 3, 3 tabs on day 4, 2 tabs on day 5, 1 tab on day 6.    Dispense:  21 tablet    Refill:  0   Lab Orders  No laboratory test(s) ordered today    Diagnostics: Spirometry:  Tracings reviewed. Her effort: Good reproducible efforts. FVC: 2.70L FEV1: 2.06L, 81% predicted FEV1/FVC ratio: 76% Interpretation: Spirometry consistent with normal pattern.  Please see scanned spirometry results for details.  Results discussed with patient/family.   Medication List:  Current Outpatient Medications  Medication Sig Dispense Refill   methylPREDNISolone  (MEDROL  DOSEPAK) 4 MG TBPK tablet Take 6 tablets on day 1, 5 tablets on day 2, 4 tabs on day 3, 3 tabs on day 4, 2 tabs on day 5, 1 tab on day 6. 21 tablet 0   albuterol  (PROVENTIL ) (2.5 MG/3ML) 0.083% nebulizer solution Take 3 mLs (2.5 mg total) by nebulization every 6 (six) hours as needed for wheezing or shortness of breath. 75 mL 11   albuterol  (VENTOLIN  HFA) 108 (90 Base) MCG/ACT inhaler Inhale 1 puff into the lungs as needed. 18 g 6   Albuterol -Budesonide  (AIRSUPRA ) 90-80 MCG/ACT AERO Inhale 2 puffs into the lungs every 4 (four) hours as needed (coughing,  wheezing, chest tightness). Do not exceed 12 puffs in 24 hours. 10.7 g 2   amoxicillin -clavulanate (AUGMENTIN ) 875-125 MG tablet Take 1 tablet by mouth 2 (two) times daily. 20 tablet 0   azelastine  (OPTIVAR ) 0.05 % ophthalmic solution Place 1 drop into both eyes daily as needed (allergies).     budesonide  (PULMICORT ) 0.5 MG/2ML nebulizer solution Take 2 mLs (0.5 mg total) by nebulization in the morning and at bedtime. 120 mL 2   Cholecalciferol (VITAMIN D) 125 MCG (5000 UT) CAPS Take 5,000 Units by mouth 4 (four) times a week.     clotrimazole-betamethasone (LOTRISONE) cream Apply 1 application  topically daily as needed (on cesarean scar).     desloratadine  (CLARINEX ) 5 MG  tablet Take 1 tablet (5 mg total) by mouth daily. 90 tablet 3   EPINEPHrine  0.3 mg/0.3 mL IJ SOAJ injection Inject 0.3 mg into the muscle as needed for anaphylaxis. 2 each 1   estradiol  (ESTRACE ) 0.1 MG/GM vaginal cream APPLY PEA-SIZE AMOUNT TO URETHRA TWICE A WEEK AT BEDTIME 42.5 g 1   Estradiol  (VAGIFEM ) 10 MCG TABS vaginal tablet Place 1 tablet (10 mcg total) vaginally 2 (two) times a week. 24 tablet 3   estrogen, conjugated,-medroxyprogesterone (PREMPRO ) 0.3-1.5 MG tablet Take 1 tablet by mouth daily. 90 tablet 3   fluticasone  furoate-vilanterol (BREO ELLIPTA ) 200-25 MCG/ACT AEPB Inhale 1 puff into the lungs daily. 90 each 3   montelukast  (SINGULAIR ) 10 MG tablet Take 1 tablet (10 mg total) by mouth at bedtime. 30 tablet 11   olmesartan-hydrochlorothiazide (BENICAR HCT) 40-12.5 MG tablet Take 1 tablet by mouth daily.     Olopatadine-Mometasone (RYALTRIS ) 665-25 MCG/ACT SUSP Place 1-2 sprays into the nose in the morning and at bedtime. 29 g 5   omeprazole  (PRILOSEC) 40 MG capsule Take 1 capsule (40 mg total) by mouth daily. (Patient not taking: Reported on 08/12/2024) 30 capsule 1   Polyethyl Glyc-Propyl Glyc PF (SYSTANE ULTRA PF) 0.4-0.3 % SOLN Place 1 drop into both eyes as needed (dry eyes).     Probiotic Product (PHILLIPS COLON HEALTH) CAPS Take 1 capsule by mouth every other day.     rosuvastatin  (CRESTOR ) 10 MG tablet Take 5 mg by mouth every evening.     Spacer/Aero-Holding Chambers (AEROCHAMBER MV) inhaler Use as instructed 1 each 0   Tezepelumab -ekko (TEZSPIRE ) 210 MG/1. SOAJ Inject 210 mg into the skin every 28 (twenty-eight) days. 1.91 mL 11   No current facility-administered medications for this visit.   Allergies: Allergies[1] I reviewed her past medical history, social history, family history, and environmental history and no significant changes have been reported from her previous visit.  Review of Systems  Constitutional:  Negative for appetite change, chills, fever and  unexpected weight change.  HENT:  Positive for congestion and rhinorrhea.   Eyes:  Negative for itching.  Respiratory:  Positive for chest tightness. Negative for cough and wheezing.   Cardiovascular:  Negative for chest pain.  Gastrointestinal:  Negative for abdominal pain.  Genitourinary:  Negative for difficulty urinating.  Skin:  Negative for rash.  Allergic/Immunologic: Positive for environmental allergies.  Neurological:  Negative for headaches.    Objective: BP 110/68   Pulse 86   Temp 98.2 F (36.8 C) (Temporal)   Resp 16   Wt 148 lb 1.9 oz (67.2 kg)   SpO2 97%   BMI 23.91 kg/m  Body mass index is 23.91 kg/m. Physical Exam Vitals and nursing note reviewed.  Constitutional:      Appearance: Normal  appearance. She is well-developed.  HENT:     Head: Normocephalic and atraumatic.     Right Ear: Tympanic membrane and external ear normal.     Left Ear: Tympanic membrane and external ear normal.     Nose: Nose normal.     Mouth/Throat:     Mouth: Mucous membranes are moist.     Pharynx: Oropharynx is clear.  Eyes:     Conjunctiva/sclera: Conjunctivae normal.  Cardiovascular:     Rate and Rhythm: Normal rate and regular rhythm.     Heart sounds: Normal heart sounds. No murmur heard.    No friction rub. No gallop.  Pulmonary:     Effort: Pulmonary effort is normal.     Breath sounds: No wheezing, rhonchi or rales.  Musculoskeletal:     Cervical back: Neck supple.  Skin:    General: Skin is warm.     Findings: No rash.  Neurological:     Mental Status: She is alert and oriented to person, place, and time.  Psychiatric:        Behavior: Behavior normal.    Previous notes and tests were reviewed. The plan was reviewed with the patient/family, and all questions/concerned were addressed.  It was my pleasure to see Lashe today and participate in her care. Please feel free to contact me with any questions or concerns.  Sincerely,  Orlan Cramp, DO Allergy  &  Immunology  [facility]  [facility] office: [phone] [facility] office: [phone]      [1]  Allergies Allergen Reactions   Advair  Diskus [Fluticasone -Salmeterol] Itching and Anxiety    Patient states that she felt like she was going crawl out of her skin.   "

## 2024-11-11 ENCOUNTER — Ambulatory Visit: Admitting: Allergy

## 2024-11-12 ENCOUNTER — Ambulatory Visit

## 2024-11-12 DIAGNOSIS — J302 Other seasonal allergic rhinitis: Secondary | ICD-10-CM

## 2024-12-24 ENCOUNTER — Ambulatory Visit: Admitting: Allergy

## 2025-01-08 ENCOUNTER — Ambulatory Visit: Admitting: "Endocrinology

## 2025-07-24 ENCOUNTER — Ambulatory Visit: Admitting: Obstetrics and Gynecology
